# Patient Record
Sex: Male | Born: 1980 | Race: Black or African American | Hispanic: No | Marital: Married | Smoking: Current some day smoker
Health system: Southern US, Community
[De-identification: ages and names within clinical notes are randomized; demographics above are authoritative.]

## PROBLEM LIST (undated history)

## (undated) DIAGNOSIS — I10 Essential (primary) hypertension: Secondary | ICD-10-CM

## (undated) DIAGNOSIS — K5792 Diverticulitis of intestine, part unspecified, without perforation or abscess without bleeding: Secondary | ICD-10-CM

## (undated) HISTORY — PX: COLON SURGERY: SHX602

---

## 2008-10-29 HISTORY — PX: PARTIAL COLECTOMY: SHX5273

## 2012-10-20 ENCOUNTER — Emergency Department (HOSPITAL_COMMUNITY)
Admission: EM | Admit: 2012-10-20 | Discharge: 2012-10-20 | Disposition: A | Discharge status: Home / Self Care | Payer: Self-pay | Attending: Emergency Medicine | Admitting: Emergency Medicine

## 2012-10-20 ENCOUNTER — Emergency Department (HOSPITAL_COMMUNITY): Payer: Self-pay

## 2012-10-20 ENCOUNTER — Encounter (HOSPITAL_COMMUNITY): Payer: Self-pay | Admitting: Emergency Medicine

## 2012-10-20 DIAGNOSIS — M545 Low back pain, unspecified: Secondary | ICD-10-CM | POA: Insufficient documentation

## 2012-10-20 DIAGNOSIS — R05 Cough: Secondary | ICD-10-CM | POA: Insufficient documentation

## 2012-10-20 DIAGNOSIS — Z8719 Personal history of other diseases of the digestive system: Secondary | ICD-10-CM | POA: Insufficient documentation

## 2012-10-20 DIAGNOSIS — R109 Unspecified abdominal pain: Secondary | ICD-10-CM | POA: Insufficient documentation

## 2012-10-20 DIAGNOSIS — R059 Cough, unspecified: Secondary | ICD-10-CM | POA: Insufficient documentation

## 2012-10-20 DIAGNOSIS — R630 Anorexia: Secondary | ICD-10-CM | POA: Insufficient documentation

## 2012-10-20 DIAGNOSIS — R0789 Other chest pain: Secondary | ICD-10-CM | POA: Insufficient documentation

## 2012-10-20 DIAGNOSIS — F17200 Nicotine dependence, unspecified, uncomplicated: Secondary | ICD-10-CM | POA: Insufficient documentation

## 2012-10-20 DIAGNOSIS — I10 Essential (primary) hypertension: Secondary | ICD-10-CM | POA: Insufficient documentation

## 2012-10-20 HISTORY — DX: Diverticulitis of intestine, part unspecified, without perforation or abscess without bleeding: K57.92

## 2012-10-20 LAB — COMPREHENSIVE METABOLIC PANEL
ALT: 23 U/L (ref 0–53)
Calcium: 9.6 mg/dL (ref 8.4–10.5)
Creatinine, Ser: 0.81 mg/dL (ref 0.50–1.35)
GFR calc Af Amer: >90 mL/min (ref >90)
Glucose, Bld: 91 mg/dL (ref 70–99)
Sodium: 138 mEq/L (ref 135–145)
Total Protein: 7.7 g/dL (ref 6.0–8.3)

## 2012-10-20 LAB — URINALYSIS, ROUTINE W REFLEX MICROSCOPIC
Ketones, ur: NEGATIVE mg/dL
Leukocytes, UA: NEGATIVE
Nitrite: NEGATIVE
Protein, ur: NEGATIVE mg/dL
Urobilinogen, UA: 0.2 mg/dL (ref 0.0–1.0)

## 2012-10-20 LAB — CBC WITH DIFFERENTIAL/PLATELET
Eosinophils Absolute: 0.2 10*3/uL (ref 0.0–0.7)
Eosinophils Relative: 4 % (ref 0–5)
Lymphs Abs: 2.4 10*3/uL (ref 0.7–4.0)
MCH: 28.2 pg (ref 26.0–34.0)
MCV: 82.4 fL (ref 78.0–100.0)
Monocytes Absolute: 1 10*3/uL (ref 0.1–1.0)
Platelets: 278 10*3/uL (ref 150–400)
RBC: 4.76 MIL/uL (ref 4.22–5.81)
RDW: 15.7 % — ABNORMAL HIGH (ref 11.5–15.5)

## 2012-10-20 LAB — LIPASE, BLOOD: Lipase: 27 U/L (ref 11–59)

## 2012-10-20 LAB — TROPONIN I: Troponin I: <0.3 ng/mL (ref <0.30)

## 2012-10-20 LAB — PRO B NATRIURETIC PEPTIDE: Pro B Natriuretic peptide (BNP): 72.3 pg/mL (ref 0–125)

## 2012-10-20 LAB — LACTIC ACID, PLASMA: Lactic Acid, Venous: 0.8 mmol/L (ref 0.5–2.2)

## 2012-10-20 MED ORDER — HYDRALAZINE HCL 20 MG/ML IJ SOLN
10.0000 mg | Freq: Once | INTRAMUSCULAR | Status: AC
  Administered 2012-10-20: 22:00:00 via INTRAVENOUS
  Filled 2012-10-20: qty 0.5

## 2012-10-20 MED ORDER — ONDANSETRON HCL 4 MG/2ML IJ SOLN
4.0000 mg | Freq: Once | INTRAMUSCULAR | Status: AC
Start: 2012-10-20 — End: 2012-10-20
  Administered 2012-10-20: 4 mg via INTRAVENOUS
  Filled 2012-10-20: qty 2

## 2012-10-20 MED ORDER — LABETALOL HCL 5 MG/ML IV SOLN
20.0000 mg | Freq: Once | INTRAVENOUS | Status: AC
  Administered 2012-10-20: 20 mg via INTRAVENOUS
  Filled 2012-10-20: qty 4

## 2012-10-20 MED ORDER — SODIUM CHLORIDE 0.9 % IV BOLUS (SEPSIS)
1000.0000 mL | Freq: Once | INTRAVENOUS | Status: AC
  Administered 2012-10-20: 1000 mL via INTRAVENOUS

## 2012-10-20 MED ORDER — HYDROCODONE-ACETAMINOPHEN 5-325 MG PO TABS: 2.0000 | ORAL_TABLET | ORAL | Status: DC | PRN

## 2012-10-20 MED ORDER — HYDROMORPHONE HCL PF 1 MG/ML IJ SOLN
1.0000 mg | Freq: Once | INTRAMUSCULAR | Status: AC
  Administered 2012-10-20: 1 mg via INTRAVENOUS
  Filled 2012-10-20: qty 1

## 2012-10-20 MED ORDER — HYDROCHLOROTHIAZIDE 25 MG PO TABS: 25.0000 mg | ORAL_TABLET | Freq: Every day | ORAL | Status: DC

## 2012-10-20 MED ORDER — IOHEXOL 300 MG/ML  SOLN
100.0000 mL | Freq: Once | INTRAMUSCULAR | Status: AC | PRN
  Administered 2012-10-20: 100 mL via INTRAVENOUS

## 2012-10-20 MED ORDER — CLONIDINE HCL 0.1 MG PO TABS
0.1000 mg | ORAL_TABLET | Freq: Once | ORAL | Status: AC
  Administered 2012-10-20: 0.1 mg via ORAL
  Filled 2012-10-20: qty 1

## 2012-10-20 MED ORDER — OXYCODONE-ACETAMINOPHEN 5-325 MG PO TABS
2.0000 | ORAL_TABLET | Freq: Once | ORAL | Status: AC
  Administered 2012-10-20: 2 via ORAL
  Filled 2012-10-20: qty 1

## 2012-10-20 NOTE — ED Notes (Signed)
MD at bedside. 

## 2012-10-20 NOTE — ED Notes (Signed)
Prior to discharging by Eden Emms, RN pt BP was elevated. Dr notified by Eden Emms, RN and new orders given.

## 2012-10-20 NOTE — ED Notes (Signed)
Informed MD about BP.

## 2012-10-20 NOTE — ED Provider Notes (Signed)
History     CSN: [id number]  Arrival date & time 10/20/12  1545   First MD Initiated Contact with Patient 10/20/12 1623      Chief Complaint  Patient presents with  . Abdominal Pain    (Consider location/radiation/quality/duration/timing/severity/associated sxs/prior treatment) HPI Comments: Patient present with lower abdominal pain has been intermittent for the past month but constant over the past 3 days. He said similar pain in the past related to his diverticulitis. He had what sounds like a colectomy 3 years ago in [place]. He said intermittent pain since but never this bad. He denies any nausea, vomiting, fever chills. No diarrhea. No testicular pain. Pain is in his lower abdomen and radiates to his low back. He had constant chest tightness since this morning associated with dry cough. No shortness of breath. Pain improves with laying in fetal position.  The history is provided by the patient.    Past Medical History  Diagnosis Date  . Diverticulitis     History reviewed. No pertinent past surgical history.  History reviewed. No pertinent family history.  History  Substance Use Topics  . Smoking status: Current Some Day Smoker  . Smokeless tobacco: Not on file  . Alcohol Use: Yes      Review of Systems  Constitutional: Positive for activity change and appetite change. Negative for fever and fatigue.  HENT: Negative for congestion.   Respiratory: Negative for cough, chest tightness and shortness of breath.   Cardiovascular: Negative for chest pain.  Gastrointestinal: Positive for abdominal pain. Negative for nausea, vomiting and diarrhea.  Genitourinary: Negative for testicular pain.  Musculoskeletal: Positive for back pain.  Skin: Negative for rash.  Neurological: Negative for dizziness, weakness and headaches.  Hematological: Negative for adenopathy.  All other systems reviewed and are negative.  A complete 10 system review of systems was obtained and  all systems are negative except as noted in the HPI and PMH.   Allergies  Review of patient's allergies indicates no known allergies.  Home Medications  No current outpatient prescriptions on file.  BP 163/112  Pulse 75  Temp 99 F (37.2 C) (Oral)  Resp 18  SpO2 100%  Physical Exam  Constitutional: He is oriented to person, place, and time. He appears well-developed and well-nourished. No distress.  HENT:  Head: Normocephalic.  Mouth/Throat: Oropharynx is clear and moist. No oropharyngeal exudate.  Eyes: Conjunctivae normal and EOM are normal. Pupils are equal, round, and reactive to light.  Neck: Normal range of motion. Neck supple.  Cardiovascular: Normal rate, regular rhythm and normal heart sounds.   No murmur heard. Pulmonary/Chest: Effort normal and breath sounds normal. No respiratory distress.  Abdominal: Soft. Bowel sounds are normal. There is tenderness. There is no rebound and no guarding.       Well-healed midline surgical scar, lower abdominal pain without guarding or rebound.  Musculoskeletal: Normal range of motion. He exhibits no edema and no tenderness.  Neurological: He is alert and oriented to person, place, and time. No cranial nerve deficit. Coordination normal.  Skin: Skin is warm.    ED Course  Procedures (including critical care time)  Labs Reviewed  CBC WITH DIFFERENTIAL - Abnormal; Notable for the following:    RDW 15.7 (*)     Neutrophils Relative 34 (*)     Monocytes Relative 18 (*)     All other components within normal limits  URINALYSIS, ROUTINE W REFLEX MICROSCOPIC - Abnormal; Notable for the following:    Specific Gravity,  Urine 1.039 (*)     All other components within normal limits  COMPREHENSIVE METABOLIC PANEL  LIPASE, BLOOD  LACTIC ACID, PLASMA  TROPONIN I  PRO B NATRIURETIC PEPTIDE   Dg Chest 2 View  10/20/2012  *RADIOLOGY REPORT*  Clinical Data: Chest pain and nausea.  CHEST - 2 VIEW  Comparison: No priors.  Findings: Lung  volumes are normal.  No consolidative airspace disease.  No pleural effusions.  No pneumothorax.  Heart size is borderline enlarged.  Mild cephalization of the pulmonary vasculature, without frank pulmonary edema.  Upper mediastinal contours are unremarkable.  IMPRESSION: 1.  Borderline cardiomegaly with mild pulmonary venous congestion, but no frank pulmonary edema.   Original Report Authenticated By: Trudie Reed, M.D.    Ct Abdomen Pelvis W Contrast  10/20/2012  *RADIOLOGY REPORT*  Clinical Data: Abdominal pain  CT ABDOMEN AND PELVIS WITH CONTRAST  Technique:  Multidetector CT imaging of the abdomen and pelvis was performed following the standard protocol during bolus administration of intravenous contrast.  Contrast: OMNIPAQUE IOHEXOL 300 MG/ML  SOLN  Comparison: None.  Findings: Liver, spleen, pancreas, adrenal glands, kidneys are within normal limits.  Gallbladder is decompressed.  Normal appendix.  Unremarkable bladder and prostate gland.  No free fluid.  No abnormal adenopathy by measurement criteria.  No disproportionate dilatation of bowel to suggest obstruction.  Several accessory ossicles involving the superior acetabulum bilaterally are noted.  No destructive bone lesion.  Images of the lower thorax demonstrate suspected cardiomegaly.  There is stranding in the subcutaneous flap at the midline of the lower abdomen likely from postoperative changes.  IMPRESSION: No acute intra-abdominal pathology.  Cardiomegaly.  Chest radiograph may be helpful.   Original Report Authenticated By: Jolaine Click, M.D.      No diagnosis found.    MDM  Abdominal pain, history of diverticulitis and colectomy. Hypertensive, abdomen soft and nonsurgical. No distress.  Work up is negative for acute pathology. Labs unremarkable. No acute abnormality on CT scan. Incidental borderline cardiomegaly noted. Patient has LVH changes and EKG.  Is tolerating by mouth fluids and has not had any vomiting in the  ED. Needs to establish care with PCP. Resource guide given.  I explained to him that he is serious hypertension at risk for eye damage, kidney damage, heart damage other medical problems. His wife states to make sure that he gets a Dr. Patient remains more concerned about his abdominal pain has been chronic for months. I assured him that his workup for his abdominal pain was negative.  He needs to establish care with a primary doctor.    Date: 10/20/2012  Rate: 68  Rhythm: normal sinus rhythm  QRS Axis: normal  Intervals: normal  ST/T Wave abnormalities: ST depressions inferiorly  Conduction Disutrbances:none  Narrative Interpretation: LVH  Old EKG Reviewed: none available       Glynn Octave, MD 10/20/12 2309

## 2012-10-20 NOTE — ED Notes (Signed)
Per patient has had abdominal pain for a month-getting worse

## 2015-11-12 ENCOUNTER — Emergency Department (HOSPITAL_BASED_OUTPATIENT_CLINIC_OR_DEPARTMENT_OTHER): Payer: Self-pay

## 2015-11-12 ENCOUNTER — Encounter (HOSPITAL_BASED_OUTPATIENT_CLINIC_OR_DEPARTMENT_OTHER): Payer: Self-pay | Admitting: Emergency Medicine

## 2015-11-12 ENCOUNTER — Emergency Department (HOSPITAL_BASED_OUTPATIENT_CLINIC_OR_DEPARTMENT_OTHER)
Admission: EM | Admit: 2015-11-12 | Discharge: 2015-11-13 | Disposition: A | Discharge status: Home / Self Care | Payer: Self-pay | Attending: Emergency Medicine | Admitting: Emergency Medicine

## 2015-11-12 DIAGNOSIS — R1084 Generalized abdominal pain: Secondary | ICD-10-CM | POA: Insufficient documentation

## 2015-11-12 DIAGNOSIS — Z8719 Personal history of other diseases of the digestive system: Secondary | ICD-10-CM | POA: Insufficient documentation

## 2015-11-12 DIAGNOSIS — I10 Essential (primary) hypertension: Secondary | ICD-10-CM | POA: Insufficient documentation

## 2015-11-12 DIAGNOSIS — F17200 Nicotine dependence, unspecified, uncomplicated: Secondary | ICD-10-CM | POA: Insufficient documentation

## 2015-11-12 DIAGNOSIS — R197 Diarrhea, unspecified: Secondary | ICD-10-CM | POA: Insufficient documentation

## 2015-11-12 HISTORY — DX: Essential (primary) hypertension: I10

## 2015-11-12 LAB — COMPREHENSIVE METABOLIC PANEL
ALBUMIN: 3.8 g/dL (ref 3.5–5.0)
ALK PHOS: 68 U/L (ref 38–126)
ALT: 42 U/L (ref 17–63)
ANION GAP: 6 (ref 5–15)
AST: 33 U/L (ref 15–41)
BUN: 13 mg/dL (ref 6–20)
CALCIUM: 9.2 mg/dL (ref 8.9–10.3)
CO2: 28 mmol/L (ref 22–32)
Chloride: 105 mmol/L (ref 101–111)
Creatinine, Ser: 0.96 mg/dL (ref 0.61–1.24)
Glucose, Bld: 95 mg/dL (ref 65–99)
Potassium: 3.9 mmol/L (ref 3.5–5.1)
SODIUM: 139 mmol/L (ref 135–145)
TOTAL PROTEIN: 7.9 g/dL (ref 6.5–8.1)
Total Bilirubin: 0.3 mg/dL (ref 0.3–1.2)

## 2015-11-12 LAB — URINE MICROSCOPIC-ADD ON

## 2015-11-12 LAB — CBC WITH DIFFERENTIAL/PLATELET
BASOS ABS: 0 10*3/uL (ref 0.0–0.1)
BASOS PCT: 1 %
EOS ABS: 0.1 10*3/uL (ref 0.0–0.7)
Eosinophils Relative: 2 %
HCT: 39.1 % (ref 39.0–52.0)
Hemoglobin: 13 g/dL (ref 13.0–17.0)
LYMPHS ABS: 2.7 10*3/uL (ref 0.7–4.0)
Lymphocytes Relative: 33 %
MCH: 27.4 pg (ref 26.0–34.0)
MCHC: 33.2 g/dL (ref 30.0–36.0)
MCV: 82.3 fL (ref 78.0–100.0)
Monocytes Absolute: 1.2 10*3/uL — ABNORMAL HIGH (ref 0.1–1.0)
Monocytes Relative: 15 %
NEUTROS PCT: 51 %
Neutro Abs: 4.1 10*3/uL (ref 1.7–7.7)
PLATELETS: 303 10*3/uL (ref 150–400)
RBC: 4.75 MIL/uL (ref 4.22–5.81)
RDW: 16.6 % — AB (ref 11.5–15.5)
WBC: 8.1 10*3/uL (ref 4.0–10.5)

## 2015-11-12 LAB — URINALYSIS, ROUTINE W REFLEX MICROSCOPIC
BILIRUBIN URINE: NEGATIVE
Glucose, UA: NEGATIVE mg/dL
KETONES UR: NEGATIVE mg/dL
Leukocytes, UA: NEGATIVE
NITRITE: NEGATIVE
PROTEIN: NEGATIVE mg/dL
SPECIFIC GRAVITY, URINE: 1.027 (ref 1.005–1.030)
pH: 5.5 (ref 5.0–8.0)

## 2015-11-12 LAB — LIPASE, BLOOD: LIPASE: 25 U/L (ref 11–51)

## 2015-11-12 MED ORDER — IOHEXOL 300 MG/ML  SOLN
100.0000 mL | Freq: Once | INTRAMUSCULAR | Status: AC | PRN
  Administered 2015-11-12: 100 mL via INTRAVENOUS

## 2015-11-12 MED ORDER — ONDANSETRON HCL 4 MG/2ML IJ SOLN
4.0000 mg | Freq: Once | INTRAMUSCULAR | Status: AC
  Administered 2015-11-12: 4 mg via INTRAVENOUS
  Filled 2015-11-12: qty 2

## 2015-11-12 MED ORDER — HYDROMORPHONE HCL 1 MG/ML IJ SOLN
1.0000 mg | Freq: Once | INTRAMUSCULAR | Status: AC
  Administered 2015-11-12: 1 mg via INTRAVENOUS
  Filled 2015-11-12: qty 1

## 2015-11-12 MED ORDER — DIPHENHYDRAMINE HCL 50 MG/ML IJ SOLN
25.0000 mg | Freq: Once | INTRAMUSCULAR | Status: AC
  Administered 2015-11-12: 25 mg via INTRAVENOUS
  Filled 2015-11-12: qty 1

## 2015-11-12 MED ORDER — IOHEXOL 300 MG/ML  SOLN
50.0000 mL | Freq: Once | INTRAMUSCULAR | Status: AC | PRN
  Administered 2015-11-12: 50 mL via ORAL

## 2015-11-12 NOTE — ED Notes (Signed)
PA Josh at bedside. 

## 2015-11-12 NOTE — ED Provider Notes (Signed)
CSN: [id number]     Arrival date & time 11/12/15  1749 History   First MD Initiated Contact with Patient 11/12/15 1959     Chief Complaint  Patient presents with  . Abdominal Pain     (Consider location/radiation/quality/duration/timing/severity/associated sxs/prior Treatment) HPI Comments: Patient with history of abdominal surgery due to diverticulitis presents with complaint of abdominal pain similar to flares that he has had in past. Patient describes suprapubic pain starting this morning with radiation to the left lower quadrant. He states that he has had diarrhea for several months which is unchanged. No nausea or vomiting. No fever. Patient has had some achiness in his left chest that he states he has his blood pressure is elevated. He reports being out of his blood pressure medications for approximately the past month. He was previously on lisinopril and amlodipine. No treatments prior to arrival.  Patient is a 35 y.o. male presenting with abdominal pain. The history is provided by the patient.  Abdominal Pain Associated symptoms: chest pain and diarrhea   Associated symptoms: no cough, no dysuria, no fever, no nausea, no sore throat and no vomiting     Past Medical History  Diagnosis Date  . Diverticulitis   . Hypertension    History reviewed. No pertinent past surgical history. History reviewed. No pertinent family history. Social History  Substance Use Topics  . Smoking status: Current Some Day Smoker  . Smokeless tobacco: None  . Alcohol Use: Yes    Review of Systems  Constitutional: Negative for fever.  HENT: Negative for rhinorrhea and sore throat.   Eyes: Negative for redness.  Respiratory: Negative for cough.   Cardiovascular: Positive for chest pain.  Gastrointestinal: Positive for abdominal pain and diarrhea. Negative for nausea and vomiting.  Genitourinary: Negative for dysuria.  Musculoskeletal: Negative for myalgias.  Skin: Negative for rash.   Neurological: Negative for headaches.    Allergies  Review of patient's allergies indicates no known allergies.  Home Medications   Prior to Admission medications   Not on File   BP 170/118 mmHg  Pulse 76  Temp(Src) 98 F (36.7 C) (Oral)  Resp 20  Ht 6\' 5"  (1.956 m)  Wt 117.935 kg  BMI 30.83 kg/m2  SpO2 95%   Physical Exam  Constitutional: He appears well-developed and well-nourished.  HENT:  Head: Normocephalic and atraumatic.  Mouth/Throat: Oropharynx is clear and moist and mucous membranes are normal. Mucous membranes are not dry.  Eyes: Conjunctivae are normal. Right eye exhibits no discharge. Left eye exhibits no discharge.  Neck: Trachea normal and normal range of motion. Neck supple. Normal carotid pulses and no JVD present. No muscular tenderness present. Carotid bruit is not present. No tracheal deviation present.  Cardiovascular: Normal rate, regular rhythm, S1 normal, S2 normal, normal heart sounds and intact distal pulses.  Exam reveals no distant heart sounds and no decreased pulses.   No murmur heard. Pulmonary/Chest: Effort normal and breath sounds normal. No respiratory distress. He has no wheezes. He exhibits no tenderness.  Abdominal: Soft. Normal aorta and bowel sounds are normal. There is tenderness (Moderate, suprapubic left lower quadrant). There is no rebound and no guarding.  Musculoskeletal: He exhibits no edema.  Neurological: He is alert.  Skin: Skin is warm and dry. He is not diaphoretic. No cyanosis. No pallor.  Psychiatric: He has a normal mood and affect.  Nursing note and vitals reviewed.   ED Course  Procedures (including critical care time) Labs Review Labs Reviewed  URINALYSIS, ROUTINE  W REFLEX MICROSCOPIC (NOT AT [facility]) - Abnormal; Notable for the following:    Hgb urine dipstick TRACE (*)    All other components within normal limits  CBC WITH DIFFERENTIAL/PLATELET - Abnormal; Notable for the following:    RDW 16.6 (*)    Monocytes  Absolute 1.2 (*)    All other components within normal limits  URINE MICROSCOPIC-ADD ON - Abnormal; Notable for the following:    Squamous Epithelial / LPF 0-5 (*)    Bacteria, UA MANY (*)    Casts HYALINE CASTS (*)    All other components within normal limits  COMPREHENSIVE METABOLIC PANEL  LIPASE, BLOOD    Imaging Review Ct Abdomen Pelvis W Contrast  11/12/2015  CLINICAL DATA:  35 year old male with acute onset of abdominal pain and chest pain. History of diverticulitis. EXAM: CT ABDOMEN AND PELVIS WITH CONTRAST TECHNIQUE: Multidetector CT imaging of the abdomen and pelvis was performed using the standard protocol following bolus administration of intravenous contrast. CONTRAST:  50mL OMNIPAQUE IOHEXOL 300 MG/ML SOLN, 100mL OMNIPAQUE IOHEXOL 300 MG/ML SOLN COMPARISON:  CT dated 11/10/2014 FINDINGS: The visualized lungs are clear. No intra-abdominal free air or free fluid. The liver, gallbladder, pancreas, spleen, adrenal glands, kidneys, visualized ureters, and urinary bladder appear unremarkable. The prostate and seminal vesicles are grossly unremarkable. There is sigmoid diverticulosis and scattered colonic diverticula without active inflammation. Minimal pericolonic stranding in the mid descending colon likely represents some chronic changes/scarring. Mild diverticulitis is less likely. This postsurgical changes of partial sigmoid resection with anastomotic sutures. No evidence of bowel obstruction. Normal appendix. For the abdominal aorta and IVC appear patent. No portal venous gas identified. There is no adenopathy. Midline vertical anterior pelvic wall incisional scar. Small fat containing umbilical hernia. Eft stable appearing bony fragments adjacent to the acetabula again noted similar to prior study and may represent ossicles. No acute fracture identified. IMPRESSION: Diverticulosis without definite active inflammatory changes. No bowel obstruction. Normal appendix. Electronically Signed    By: Elgie CollardArash  Radparvar M.D.   On: 11/12/2015 23:58   I have personally reviewed and evaluated these images and lab results as part of my medical decision-making.   EKG Interpretation   Date/Time:  Saturday November 12 2015 18:03:26 EST Ventricular Rate:  88 PR Interval:  158 QRS Duration: 104 QT Interval:  398 QTC Calculation: 481 R Axis:   73 Text Interpretation:  Normal sinus rhythm Minimal voltage criteria for  LVH, may be normal variant Prolonged QT Abnormal ECG No previous ECGs  available Confirmed by Manus GunningANCOUR  MD, STEPHEN [phone]([id number]) on 11/12/2015 6:07:00  PM       8:35 PM Patient seen and examined. Work-up initiated. Medications ordered.   Vital signs reviewed and are as follows: BP 170/118 mmHg  Pulse 76  Temp(Src) 98 F (36.7 C) (Oral)  Resp 20  Ht 6\' 5"  (1.956 m)  Wt 117.935 kg  BMI 30.83 kg/m2  SpO2 95%  10:20 PM Patient with continued abdominal pain despite 2 doses IV pain medication. Labs are reassuring, but given his history and the fact that I cannot get his pain under control, will need to order CT scan to rule out more significant process.  12:15 AM CT performed and is negative. Patient informed of results. Symptoms are relatively well controlled at this time. Plan is for discharge to home with pain medication. No indications for antibiotics. Patient has PCP and GI follow-up. Encouraged to follow-up with them. Patient states that he feels comfortable going home.  Patient did have  some itching prior to CT. This is likely related to IV narcotic. Benadryl was given with good relief. No signs of anaphylaxis at any time.  The patient was urged to return to the Emergency Department immediately with worsening of current symptoms, worsening abdominal pain, persistent vomiting, blood noted in stools, fever, or any other concerns. The patient verbalized understanding.   Patient counseled on use of narcotic pain medications. Counseled not to combine these medications with  others containing tylenol. Urged not to drink alcohol, drive, or perform any other activities that requires focus while taking these medications. The patient verbalizes understanding and agrees with the plan.    MDM   Final diagnoses:  Generalized abdominal pain  Essential hypertension   Abd pain: Labs and CT are reassuring. No evidence of complication from previous surgery. No evidence of active diverticulitis. Feel symptomatic control appropriate at this time. No indications for admission.  Hypertension: No acute onset of end organ damage.   Renne Crigler, PA-C 11/13/15 1342  Glynn Octave, MD 11/13/15 [phone]

## 2015-11-12 NOTE — ED Notes (Signed)
Pt reports acute onset of abdominal pain and chest pain this am, reports history of diverticulitis, + nausea, no vomiting or diarrhea

## 2015-11-13 MED ORDER — AMLODIPINE BESYLATE 10 MG PO TABS: 10.0000 mg | ORAL_TABLET | Freq: Every day | ORAL | Status: DC

## 2015-11-13 MED ORDER — HYDROCODONE-ACETAMINOPHEN 5-325 MG PO TABS: ORAL_TABLET | ORAL | Status: DC

## 2015-11-13 MED ORDER — LISINOPRIL 10 MG PO TABS: 10.0000 mg | ORAL_TABLET | Freq: Every day | ORAL | Status: DC

## 2015-11-13 NOTE — ED Notes (Signed)
Ass[place] care of patient from [place]. Pt resting quietly at this time, as he has just returned from CT. VSS. States he has been itching since earlier in his ED visit. Updated EDP and meds order. Call bell within reach. No respiratory distress noted. Family at side.

## 2015-11-13 NOTE — Discharge Instructions (Signed)
Please read and follow all provided instructions.  Your diagnoses today include:  1. Generalized abdominal pain   2. Essential hypertension     Tests performed today include:  Blood counts and electrolytes  Blood tests to check liver and kidney function  Blood tests to check pancreas function  Urine test to look for infection  CT scan - no current infection  Vital signs. See below for your results today.   Medications prescribed:   Vicodin (hydrocodone/acetaminophen) - narcotic pain medication  DO NOT drive or perform any activities that require you to be awake and alert because this medicine can make you drowsy. BE VERY CAREFUL not to take multiple medicines containing Tylenol (also called acetaminophen). Doing so can lead to an overdose which can damage your liver and cause liver failure and possibly death.  Take any prescribed medications only as directed.  Home care instructions:   Follow any educational materials contained in this packet.  Follow-up instructions: Please follow-up with your primary care provider in the next 3 days for further evaluation of your symptoms.    Return instructions:  SEEK IMMEDIATE MEDICAL ATTENTION IF:  The pain does not go away or becomes severe   A temperature above 101F develops   Repeated vomiting occurs (multiple episodes)   The pain becomes localized to portions of the abdomen. The right side could possibly be appendicitis. In an adult, the left lower portion of the abdomen could be colitis or diverticulitis.   Blood is being passed in stools or vomit (bright red or black tarry stools)   You develop chest pain, difficulty breathing, dizziness or fainting, or become confused, poorly responsive, or inconsolable (young children)  If you have any other emergent concerns regarding your health  Additional Information: Abdominal (belly) pain can be caused by many things. Your caregiver performed an examination and possibly ordered  blood/urine tests and imaging (CT scan, x-rays, ultrasound). Many cases can be observed and treated at home after initial evaluation in the emergency department. Even though you are being discharged home, abdominal pain can be unpredictable. Therefore, you need a repeated exam if your pain does not resolve, returns, or worsens. Most patients with abdominal pain don't have to be admitted to the hospital or have surgery, but serious problems like appendicitis and gallbladder attacks can start out as nonspecific pain. Many abdominal conditions cannot be diagnosed in one visit, so follow-up evaluations are very important.  Your vital signs today were: BP 188/126 mmHg   Pulse 82   Temp(Src) 98 F (36.7 C) (Oral)   Resp 20   Ht 6\' 5"  (1.956 m)   Wt 117.935 kg   BMI 30.83 kg/m2   SpO2 98% If your blood pressure (bp) was elevated above 135/85 this visit, please have this repeated by your doctor within one month. --------------

## 2015-11-13 NOTE — ED Notes (Signed)
Josh, PA, Primary for patient, made aware of Blood Pressure - 178/113.

## 2015-12-18 ENCOUNTER — Encounter (HOSPITAL_BASED_OUTPATIENT_CLINIC_OR_DEPARTMENT_OTHER): Payer: Self-pay | Admitting: *Deleted

## 2015-12-18 ENCOUNTER — Emergency Department (HOSPITAL_BASED_OUTPATIENT_CLINIC_OR_DEPARTMENT_OTHER)
Admission: EM | Admit: 2015-12-18 | Discharge: 2015-12-18 | Disposition: A | Discharge status: Home / Self Care | Payer: Self-pay | Attending: Emergency Medicine | Admitting: Emergency Medicine

## 2015-12-18 ENCOUNTER — Other Ambulatory Visit: Payer: Self-pay

## 2015-12-18 DIAGNOSIS — R109 Unspecified abdominal pain: Secondary | ICD-10-CM

## 2015-12-18 DIAGNOSIS — R1030 Lower abdominal pain, unspecified: Secondary | ICD-10-CM | POA: Insufficient documentation

## 2015-12-18 DIAGNOSIS — Z760 Encounter for issue of repeat prescription: Secondary | ICD-10-CM | POA: Insufficient documentation

## 2015-12-18 DIAGNOSIS — R079 Chest pain, unspecified: Secondary | ICD-10-CM | POA: Insufficient documentation

## 2015-12-18 DIAGNOSIS — Z8719 Personal history of other diseases of the digestive system: Secondary | ICD-10-CM | POA: Insufficient documentation

## 2015-12-18 DIAGNOSIS — I10 Essential (primary) hypertension: Secondary | ICD-10-CM | POA: Insufficient documentation

## 2015-12-18 DIAGNOSIS — F17200 Nicotine dependence, unspecified, uncomplicated: Secondary | ICD-10-CM | POA: Insufficient documentation

## 2015-12-18 LAB — URINALYSIS, ROUTINE W REFLEX MICROSCOPIC
Bilirubin Urine: NEGATIVE
GLUCOSE, UA: NEGATIVE mg/dL
KETONES UR: NEGATIVE mg/dL
Nitrite: NEGATIVE
PROTEIN: NEGATIVE mg/dL
Specific Gravity, Urine: 1.024 (ref 1.005–1.030)
pH: 5.5 (ref 5.0–8.0)

## 2015-12-18 LAB — COMPREHENSIVE METABOLIC PANEL
ALBUMIN: 4.4 g/dL (ref 3.5–5.0)
ALK PHOS: 62 U/L (ref 38–126)
ALT: 30 U/L (ref 17–63)
ANION GAP: 9 (ref 5–15)
AST: 23 U/L (ref 15–41)
BILIRUBIN TOTAL: 0.5 mg/dL (ref 0.3–1.2)
BUN: 14 mg/dL (ref 6–20)
CALCIUM: 9.3 mg/dL (ref 8.9–10.3)
CO2: 25 mmol/L (ref 22–32)
Chloride: 106 mmol/L (ref 101–111)
Creatinine, Ser: 0.89 mg/dL (ref 0.61–1.24)
GFR calc non Af Amer: >60 mL/min (ref >60)
GLUCOSE: 102 mg/dL — AB (ref 65–99)
POTASSIUM: 3.5 mmol/L (ref 3.5–5.1)
Sodium: 140 mmol/L (ref 135–145)
TOTAL PROTEIN: 8.3 g/dL — AB (ref 6.5–8.1)

## 2015-12-18 LAB — URINE MICROSCOPIC-ADD ON: BACTERIA UA: NONE SEEN

## 2015-12-18 LAB — CBC
HEMATOCRIT: 38.2 % — AB (ref 39.0–52.0)
HEMOGLOBIN: 12.9 g/dL — AB (ref 13.0–17.0)
MCH: 27.3 pg (ref 26.0–34.0)
MCHC: 33.8 g/dL (ref 30.0–36.0)
MCV: 80.8 fL (ref 78.0–100.0)
Platelets: 288 10*3/uL (ref 150–400)
RBC: 4.73 MIL/uL (ref 4.22–5.81)
RDW: 17.2 % — ABNORMAL HIGH (ref 11.5–15.5)
WBC: 7 10*3/uL (ref 4.0–10.5)

## 2015-12-18 LAB — TROPONIN I

## 2015-12-18 LAB — BRAIN NATRIURETIC PEPTIDE: B Natriuretic Peptide: 4.4 pg/mL (ref 0.0–100.0)

## 2015-12-18 MED ORDER — OXYCODONE-ACETAMINOPHEN 5-325 MG PO TABS
1.0000 | ORAL_TABLET | Freq: Once | ORAL | Status: AC
  Administered 2015-12-18: 1 via ORAL
  Filled 2015-12-18: qty 1

## 2015-12-18 MED ORDER — LISINOPRIL 10 MG PO TABS: 10.0000 mg | ORAL_TABLET | Freq: Every day | ORAL | Status: DC

## 2015-12-18 MED ORDER — AMLODIPINE BESYLATE 10 MG PO TABS: 10.0000 mg | ORAL_TABLET | Freq: Every day | ORAL | Status: DC

## 2015-12-18 NOTE — ED Notes (Signed)
Pt here for epigastric and left CP that has been "going on for years".  Pt has been having sob with this.  Pt states that the pain is worse than usual.  Pt denies having cardiac hx or having been evaluated for this pain he has been having for years.  Pt is hypertensive in triage and reports that he is taking BP meds as prescribed. Last med today

## 2015-12-18 NOTE — Discharge Instructions (Signed)
Return to the ED with any concerns including vomiting and not able to keep down liquids, difficulty urinating, fever/chills, decreased level of alertness/lethargy, or any other alarming symptoms

## 2015-12-18 NOTE — ED Provider Notes (Signed)
CSN: [id number]     Arrival date & time 12/18/15  1912 History  By signing my name below, I, Linus Galas, attest that this documentation has been prepared under the direction and in the presence of No att. providers found. Electronically Signed: Linus Galas, ED Scribe. 12/18/2015. 11:01 PM.   Chief Complaint  Patient presents with  . Abdominal Pain  . Chest Pain   Patient is a 35 y.o. male presenting with abdominal pain. The history is provided by the patient. No language interpreter was used.  Abdominal Pain Pain location:  Epigastric Pain radiates to:  Does not radiate Pain severity:  Mild Onset quality:  Gradual Timing:  Intermittent Progression:  Worsening Chronicity:  Recurrent Relieved by:  Nothing Worsened by:  Nothing tried Ineffective treatments:  None tried Associated symptoms: no chills, no diarrhea, no fever, no nausea and no vomiting    HPI Comments: Joshua Patel is a 35 y.o. male with a PMHx diverticulitis and HTN who presents to the Emergency Department complaining of intermittent epigastric abdominal pain that worsened today. Pt reports this pain is similar to his pain he had in January 2016 when seen in the ED. Pt denies any fevers, chills, nausea, vomiting, diarrhea, or any other symptoms at this time. Pt is compliant with BP medication but had his last dose today.   Past Medical History  Diagnosis Date  . Diverticulitis   . Hypertension    History reviewed. No pertinent past surgical history. No family history on file. Social History  Substance Use Topics  . Smoking status: Current Some Day Smoker  . Smokeless tobacco: None  . Alcohol Use: Yes    Review of Systems  Constitutional: Negative for fever and chills.  Gastrointestinal: Positive for abdominal pain. Negative for nausea, vomiting and diarrhea.  All other systems reviewed and are negative.   Allergies  Shellfish allergy and Contrast media  Home Medications   Prior to Admission  medications   Medication Sig Start Date End Date Taking? Authorizing Provider  amLODipine (NORVASC) 10 MG tablet Take 1 tablet (10 mg total) by mouth daily. 12/18/15   Jerelyn Scott, MD  HYDROcodone-acetaminophen (NORCO/VICODIN) 5-325 MG tablet Take 1-2 tablets every 6 hours as needed for severe pain 11/13/15   Renne Crigler, PA-C  lisinopril (PRINIVIL,ZESTRIL) 10 MG tablet Take 1 tablet (10 mg total) by mouth daily. 12/18/15   Jerelyn Scott, MD   BP 141/94 mmHg  Pulse 78  Temp(Src) 98 F (36.7 C) (Oral)  Resp 18  Wt 250 lb (113.399 kg)  SpO2 98%  Vitals reviewed Physical Exam  Physical Examination: General appearance - alert, well appearing, and in no distress Mental status - alert, oriented to person, place, and time Eyes - no conjunctival injection, no scleral icterus Mouth - mucous membranes moist, pharynx normal without lesions Chest - clear to auscultation, no wheezes, rales or rhonchi, symmetric air entry Heart - normal rate, regular rhythm, normal S1, S2, no murmurs, rubs, clicks or gallops Abdomen - soft, mild lower abdominal/suprapubic tenderness to palpation, no gaurding or rebound tenderness, nondistended, no masses or organomegaly Neurological - alert, oriented, normal speech Extremities - peripheral pulses normal, no pedal edema, no clubbing or cyanosis Skin - normal coloration and turgor, no rashes  ED Course  Procedures   DIAGNOSTIC STUDIES: Oxygen Saturation is 94% on room air, normal by my interpretation.    COORDINATION OF CARE: 9:00 PM Will give pain medication. Will order blood work, urinalysis, and EKG.  Discussed treatment plan with pt  at bedside and pt agreed to plan.   Labs Review Labs Reviewed  URINALYSIS, ROUTINE W REFLEX MICROSCOPIC (NOT AT [facility]) - Abnormal; Notable for the following:    Hgb urine dipstick MODERATE (*)    Leukocytes, UA TRACE (*)    All other components within normal limits  COMPREHENSIVE METABOLIC PANEL - Abnormal; Notable for the  following:    Glucose, Bld 102 (*)    Total Protein 8.3 (*)    All other components within normal limits  CBC - Abnormal; Notable for the following:    Hemoglobin 12.9 (*)    HCT 38.2 (*)    RDW 17.2 (*)    All other components within normal limits  URINE MICROSCOPIC-ADD ON - Abnormal; Notable for the following:    Squamous Epithelial / LPF 0-5 (*)    All other components within normal limits  TROPONIN I  BRAIN NATRIURETIC PEPTIDE    Imaging Review No results found. I have personally reviewed and evaluated these images and lab results as part of my medical decision-making.   EKG Interpretation None      MDM   Final diagnoses:  Abdominal pain, unspecified abdominal location    Pt presenting with c/o lower abdominal pain, also hypertensive.  He states he took his last bp meds today and is requesting a refill.  Labs are reassuring, urine is also reassuring.  Per chart review he had normal CT scan with similar symptoms recently.  D/w patient the importance of followup as an outpatient if symptoms continue.  Discharged with strict return precautions.  Pt agreeable with plan.  I personally performed the services described in this documentation, which was scribed in my presence. The recorded information has been reviewed and is accurate.     Jerelyn Scott, MD 12/18/15 [phone]

## 2016-05-08 ENCOUNTER — Observation Stay (HOSPITAL_BASED_OUTPATIENT_CLINIC_OR_DEPARTMENT_OTHER)
Admission: EM | Admit: 2016-05-08 | Discharge: 2016-05-10 | Disposition: A | Discharge status: Home / Self Care | Payer: Managed Care, Other (non HMO) | Attending: Internal Medicine | Admitting: Internal Medicine

## 2016-05-08 ENCOUNTER — Emergency Department (HOSPITAL_BASED_OUTPATIENT_CLINIC_OR_DEPARTMENT_OTHER): Payer: Managed Care, Other (non HMO)

## 2016-05-08 ENCOUNTER — Encounter (HOSPITAL_BASED_OUTPATIENT_CLINIC_OR_DEPARTMENT_OTHER): Payer: Self-pay

## 2016-05-08 DIAGNOSIS — I10 Essential (primary) hypertension: Secondary | ICD-10-CM | POA: Diagnosis present

## 2016-05-08 DIAGNOSIS — K56600 Partial intestinal obstruction, unspecified as to cause: Secondary | ICD-10-CM

## 2016-05-08 DIAGNOSIS — F17200 Nicotine dependence, unspecified, uncomplicated: Secondary | ICD-10-CM | POA: Diagnosis not present

## 2016-05-08 DIAGNOSIS — Z9049 Acquired absence of other specified parts of digestive tract: Secondary | ICD-10-CM | POA: Diagnosis not present

## 2016-05-08 DIAGNOSIS — Z8719 Personal history of other diseases of the digestive system: Secondary | ICD-10-CM | POA: Diagnosis not present

## 2016-05-08 DIAGNOSIS — R109 Unspecified abdominal pain: Secondary | ICD-10-CM | POA: Diagnosis present

## 2016-05-08 DIAGNOSIS — K56609 Unspecified intestinal obstruction, unspecified as to partial versus complete obstruction: Secondary | ICD-10-CM

## 2016-05-08 DIAGNOSIS — K5669 Other intestinal obstruction: Secondary | ICD-10-CM | POA: Diagnosis not present

## 2016-05-08 LAB — URINALYSIS, ROUTINE W REFLEX MICROSCOPIC
Bilirubin Urine: NEGATIVE
GLUCOSE, UA: NEGATIVE mg/dL
Hgb urine dipstick: NEGATIVE
Ketones, ur: 15 mg/dL — AB
NITRITE: NEGATIVE
PH: 5.5 (ref 5.0–8.0)
PROTEIN: NEGATIVE mg/dL
Specific Gravity, Urine: 1.029 (ref 1.005–1.030)

## 2016-05-08 LAB — COMPREHENSIVE METABOLIC PANEL
ALBUMIN: 4 g/dL (ref 3.5–5.0)
ALK PHOS: 62 U/L (ref 38–126)
ALT: 36 U/L (ref 17–63)
ANION GAP: 8 (ref 5–15)
AST: 27 U/L (ref 15–41)
BILIRUBIN TOTAL: 0.4 mg/dL (ref 0.3–1.2)
BUN: 11 mg/dL (ref 6–20)
CALCIUM: 9.2 mg/dL (ref 8.9–10.3)
CO2: 28 mmol/L (ref 22–32)
CREATININE: 1.01 mg/dL (ref 0.61–1.24)
Chloride: 104 mmol/L (ref 101–111)
GFR calc non Af Amer: >60 mL/min (ref >60)
GLUCOSE: 98 mg/dL (ref 65–99)
Potassium: 3.8 mmol/L (ref 3.5–5.1)
Sodium: 140 mmol/L (ref 135–145)
TOTAL PROTEIN: 8 g/dL (ref 6.5–8.1)

## 2016-05-08 LAB — URINE MICROSCOPIC-ADD ON

## 2016-05-08 LAB — CBC WITH DIFFERENTIAL/PLATELET
Basophils Absolute: 0 10*3/uL (ref 0.0–0.1)
Basophils Relative: 1 %
EOS ABS: 0.1 10*3/uL (ref 0.0–0.7)
EOS PCT: 1 %
HCT: 39.4 % (ref 39.0–52.0)
Hemoglobin: 13.4 g/dL (ref 13.0–17.0)
LYMPHS ABS: 2.4 10*3/uL (ref 0.7–4.0)
Lymphocytes Relative: 39 %
MCH: 28.8 pg (ref 26.0–34.0)
MCHC: 34 g/dL (ref 30.0–36.0)
MCV: 84.7 fL (ref 78.0–100.0)
MONO ABS: 1.1 10*3/uL — AB (ref 0.1–1.0)
Monocytes Relative: 17 %
Neutro Abs: 2.6 10*3/uL (ref 1.7–7.7)
Neutrophils Relative %: 42 %
PLATELETS: 264 10*3/uL (ref 150–400)
RBC: 4.65 MIL/uL (ref 4.22–5.81)
RDW: 15.9 % — AB (ref 11.5–15.5)
WBC: 6.2 10*3/uL (ref 4.0–10.5)

## 2016-05-08 LAB — LIPASE, BLOOD: Lipase: 27 U/L (ref 11–51)

## 2016-05-08 MED ORDER — DEXTROSE 5 % IV SOLN
1.0000 g | Freq: Once | INTRAVENOUS | Status: AC
  Administered 2016-05-08: 1 g via INTRAVENOUS
  Filled 2016-05-08: qty 10

## 2016-05-08 MED ORDER — AMLODIPINE BESYLATE 5 MG PO TABS
5.0000 mg | ORAL_TABLET | Freq: Once | ORAL | Status: AC
  Administered 2016-05-08: 5 mg via ORAL
  Filled 2016-05-08: qty 1

## 2016-05-08 MED ORDER — AMLODIPINE BESYLATE 5 MG PO TABS
5.0000 mg | ORAL_TABLET | Freq: Every day | ORAL | Status: DC
  Administered 2016-05-09 – 2016-05-10 (×2): 5 mg via ORAL
  Filled 2016-05-08 (×2): qty 1

## 2016-05-08 MED ORDER — LABETALOL HCL 5 MG/ML IV SOLN
20.0000 mg | Freq: Once | INTRAVENOUS | Status: AC
  Administered 2016-05-08: 20 mg via INTRAVENOUS
  Filled 2016-05-08: qty 4

## 2016-05-08 MED ORDER — SODIUM CHLORIDE 0.9 % IV SOLN
INTRAVENOUS | Status: DC
  Administered 2016-05-08 – 2016-05-09 (×2): 125 mL/h via INTRAVENOUS
  Administered 2016-05-10: 06:00:00 via INTRAVENOUS

## 2016-05-08 MED ORDER — LISINOPRIL 20 MG PO TABS
20.0000 mg | ORAL_TABLET | Freq: Every day | ORAL | Status: DC
  Administered 2016-05-08 – 2016-05-10 (×3): 20 mg via ORAL
  Filled 2016-05-08 (×3): qty 1

## 2016-05-08 MED ORDER — ENOXAPARIN SODIUM 40 MG/0.4ML ~~LOC~~ SOLN
40.0000 mg | SUBCUTANEOUS | Status: DC
  Administered 2016-05-08 – 2016-05-09 (×2): 40 mg via SUBCUTANEOUS
  Filled 2016-05-08 (×2): qty 0.4

## 2016-05-08 MED ORDER — SODIUM CHLORIDE 0.9 % IV BOLUS (SEPSIS)
1000.0000 mL | Freq: Once | INTRAVENOUS | Status: AC
  Administered 2016-05-08: 1000 mL via INTRAVENOUS

## 2016-05-08 MED ORDER — DEXTROSE-NACL 5-0.45 % IV SOLN
INTRAVENOUS | Status: AC
  Administered 2016-05-08: 17:00:00 via INTRAVENOUS

## 2016-05-08 MED ORDER — HYDRALAZINE HCL 20 MG/ML IJ SOLN
10.0000 mg | INTRAMUSCULAR | Status: DC | PRN
  Administered 2016-05-09: 10 mg via INTRAVENOUS
  Filled 2016-05-08: qty 1

## 2016-05-08 MED ORDER — MORPHINE SULFATE (PF) 4 MG/ML IV SOLN
4.0000 mg | Freq: Once | INTRAVENOUS | Status: AC
  Administered 2016-05-08: 4 mg via INTRAVENOUS
  Filled 2016-05-08: qty 1

## 2016-05-08 MED ORDER — ONDANSETRON HCL 4 MG/2ML IJ SOLN
4.0000 mg | Freq: Once | INTRAMUSCULAR | Status: AC
  Administered 2016-05-08: 4 mg via INTRAVENOUS
  Filled 2016-05-08: qty 2

## 2016-05-08 MED ORDER — HYDROCODONE-ACETAMINOPHEN 5-325 MG PO TABS
1.0000 | ORAL_TABLET | Freq: Four times a day (QID) | ORAL | Status: DC | PRN
  Administered 2016-05-08 (×2): 1 via ORAL
  Administered 2016-05-09 – 2016-05-10 (×3): 2 via ORAL
  Filled 2016-05-08 (×2): qty 2
  Filled 2016-05-08 (×2): qty 1
  Filled 2016-05-08: qty 2

## 2016-05-08 MED ORDER — HYDROMORPHONE HCL 1 MG/ML IJ SOLN
1.0000 mg | Freq: Once | INTRAMUSCULAR | Status: AC
  Administered 2016-05-08: 1 mg via INTRAVENOUS
  Filled 2016-05-08: qty 1

## 2016-05-08 MED ORDER — HYDROCODONE-ACETAMINOPHEN 5-325 MG PO TABS: 1.0000 | ORAL_TABLET | Freq: Four times a day (QID) | ORAL | Status: DC | PRN

## 2016-05-08 MED ORDER — LABETALOL HCL 5 MG/ML IV SOLN
10.0000 mg | Freq: Once | INTRAVENOUS | Status: AC
  Administered 2016-05-08: 10 mg via INTRAVENOUS
  Filled 2016-05-08: qty 4

## 2016-05-08 NOTE — ED Notes (Signed)
Patient transported to CT 

## 2016-05-08 NOTE — ED Notes (Signed)
Patient states he has a h/o diverticulitis and this feels similar.  Pain started yesterday afternoon and has continued through the evening.  Pain is increasing and is specifically in the LLQ and RLQ.  Patient in NAD at this time with even and unlabored respirations.

## 2016-05-08 NOTE — ED Notes (Addendum)
Radiology at bedside giving instructions on CT contrast.

## 2016-05-08 NOTE — ED Notes (Signed)
C/o abd pain, n/d started yesterday-NAD-steady gait

## 2016-05-08 NOTE — ED Provider Notes (Signed)
CSN: [id number]     Arrival date & time 05/08/16  1221 History   First MD Initiated Contact with Patient 05/08/16 1236     Chief Complaint  Patient presents with  . Abdominal Pain     (Consider location/radiation/quality/duration/timing/severity/associated sxs/prior Treatment) HPI   LLQ abdominal pain, feels like something about to rip, and squeezing pain. Associated nausea. Started last night and pain 9/10. Hx of diverticulitis and feels this way.  Small amt diarrhea but felt like needed to force it out.  No dysuria.   Chart review shows 4 CT scans in the last year at various hospitals. all without acute findings. Had perforated Diverticulitis at [facility] 07/24/2009, recurrent diverticulitis 2011, and many negative CT abdomen/pelvis since 2011.   Had sigmoid resection    Past Medical History  Diagnosis Date  . Diverticulitis   . Hypertension    History reviewed. No pertinent past surgical history. No family history on file. Social History  Substance Use Topics  . Smoking status: Current Some Day Smoker  . Smokeless tobacco: None  . Alcohol Use: Yes     Comment: weekly    Review of Systems  Constitutional: Positive for chills. Negative for fever.  HENT: Negative for sore throat.   Eyes: Negative for visual disturbance.  Respiratory: Negative for cough and shortness of breath.   Cardiovascular: Negative for chest pain.  Gastrointestinal: Positive for nausea, abdominal pain and diarrhea. Negative for vomiting, constipation and blood in stool.  Genitourinary: Negative for dysuria and difficulty urinating.  Musculoskeletal: Positive for arthralgias (on left side arm and leg). Negative for back pain and neck stiffness.  Skin: Negative for rash.  Neurological: Positive for light-headedness and headaches. Negative for syncope.      Allergies  Shellfish allergy and Contrast media  Home Medications   Prior to Admission medications   Not on File   BP 175/124 mmHg  Pulse  74  Temp(Src) 98.7 F (37.1 C) (Oral)  Resp 20  Ht  (1.956 m)  Wt 260 lb (117.935 kg)  BMI 30.83 kg/m2  SpO2 99% Physical Exam  Constitutional: He is oriented to person, place, and time. He appears well-developed and well-nourished. No distress.  HENT:  Head: Normocephalic and atraumatic.  Eyes: Conjunctivae and EOM are normal.  Neck: Normal range of motion.  Cardiovascular: Normal rate, regular rhythm, normal heart sounds and intact distal pulses.  Exam reveals no gallop and no friction rub.   No murmur heard. Pulmonary/Chest: Effort normal and breath sounds normal. No respiratory distress. He has no wheezes. He has no rales.  Abdominal: Soft. He exhibits no distension. There is tenderness (LLQ, LUQ). There is no guarding.  Musculoskeletal: He exhibits no edema.  Neurological: He is alert and oriented to person, place, and time. He has normal strength. No cranial nerve deficit (tested and normal) or sensory deficit. Coordination normal. GCS eye subscore is 4. GCS verbal subscore is 5. GCS motor subscore is 6.  Skin: Skin is warm and dry. He is not diaphoretic.  Nursing note and vitals reviewed.   ED Course  Procedures (including critical care time) Labs Review Labs Reviewed  URINALYSIS, ROUTINE W REFLEX MICROSCOPIC (NOT AT [facility]) - Abnormal; Notable for the following:    Ketones, ur 15 (*)    Leukocytes, UA TRACE (*)    All other components within normal limits  URINE MICROSCOPIC-ADD ON - Abnormal; Notable for the following:    Squamous [id number] / LPF 0-5 (*)    Bacteria, UA MANY (*)  Casts HYALINE CASTS (*)    All other components within normal limits  CBC WITH DIFFERENTIAL/PLATELET - Abnormal; Notable for the following:    RDW 15.9 (*)    Monocytes Absolute 1.1 (*)    All other components within normal limits  URINE CULTURE  COMPREHENSIVE METABOLIC PANEL  LIPASE, BLOOD    Imaging Review Ct Abdomen Pelvis Wo Contrast  05/08/2016  CLINICAL DATA:  Left side  abdominal pain beginning last night now radiating into the right upper quadrant. Diarrhea and constipation. EXAM: CT ABDOMEN AND PELVIS WITHOUT CONTRAST TECHNIQUE: Multidetector CT imaging of the abdomen and pelvis was performed following the standard protocol without IV contrast. COMPARISON:  CT abdomen and pelvis 01/21/2016. FINDINGS: Heart size is upper normal. No pleural or pericardial effusion. Mild dependent atelectatic change is noted the lung bases. Proximal small bowel loops are dilated up to 3.5 cm. Transition point is seen in the left upper quadrant the abdomen where loops appear adhesed together. More distal loops are completely decompressed. Surgical anastomosis at the rectosigmoid junction consistent with partial sigmoid resection is noted and unchanged. Scattered diverticular seen in the colon. No evidence of diverticulitis. The appendix appears normal. No pneumatosis, portal venous gas or free intraperitoneal air is identified. There is no fluid collection. The kidneys, liver, gallbladder, adrenal glands, spleen and pancreas are unremarkable. No lymphadenopathy is seen. No lytic or sclerotic bony lesion is identified. Prominent accessory ossicles about the acetabuli bilaterally are noted. IMPRESSION: Findings compatible with partial or early small bowel obstruction which appears to be due to adhesions. Transition point is likely in the left upper quadrant of the abdomen where small bowel loops appear adhesed together. No evidence of bowel ischemia. Diverticulosis without diverticulitis. Postoperative change sigmoid colon noted. Electronically Signed   By: Drusilla Kannerhomas  Dalessio M.D.   On: 05/08/2016 15:49   I have personally reviewed and evaluated these images and lab results as part of my medical decision-making.   EKG Interpretation None      MDM   Final diagnoses:  Partial small bowel obstruction (HCC)   35 year old male with a history of hypertension and diverticulitis status post sigmoid  resection per patient, presents with concern of left-sided abdominal pain.  Chart review shows patient has had multiple emergency department visits for both chest pain and abdominal pain at several different facilities, and has had 4 normal CT scans in the past year, however given the severity of pain described, and history of diverticulitis and surgery, CT was obtained. CT abdomen and pelvis shows evidence of early or partial small bowel obstruction with transition point in the left upper quadrant. Patient is not having emesis, reports difficulty having bowel movements, however reports episodes of diarrhea, and that he is passing flatus--however, given this is the area that patient describes pain, and the character of his pain, could be consistent with bowel obstruction, well admit patient for observation for likely early bowel obstruction. Is a Dr. Derrell LollingIngram surgery who recommends surgical consult when patient is an inpatient if he worsens or if it's clinically indicated. We agree that NG tube is not indicated at this time, given patient without emesis.  Will place on IV fluids, make NPO and have pt admitted to hospitalist for further observation. Patient also with signs of possible urinary tract infection, was given a dose of Rocephin in the emergency department. Pt with htn, off his lisinopril for last 2 weeks, no symptoms of htn emergency. Will give labetalol.      Alvira MondayErin Olajuwon Fosdick, MD 05/08/16  1715 

## 2016-05-08 NOTE — Progress Notes (Signed)
BP very high. Norvasc 5mg  (chart says 10mg  at home but needs review) given tonight. Hydralazine ordered prn. Lisinopril 20mg  (home med) started now as well. KJKG, NP [facility]

## 2016-05-08 NOTE — ED Notes (Signed)
MD at bedside. 

## 2016-05-08 NOTE — H&P (Signed)
History and Physical    Colon Rueth ZOX:[id number] DOB: June 20, 1981 DOA: 05/08/2016   PCP: No primary care provider on file. Chief Complaint:  Chief Complaint  Patient presents with  . Abdominal Pain    HPI: Joshua Patel is a 35 y.o. male with medical history significant of Diverticulitis with perforated diverticulum requiring partial colectomy, frequent ED visits for chronic abdominal pain, HTN.  Patient presents to the ED with c/o LUQ abdominal pain.  Symptoms onset last night, last BM was this morning, squeezing quality pain, 9/10 intensity.  Nothing makes it better or worse.    ED Course: PSBO with LUQ adhesions.  Review of Systems: As per HPI otherwise 10 point review of systems negative.    Past Medical History  Diagnosis Date  . Diverticulitis 2010, 2011  . Hypertension     Past Surgical History  Procedure Laterality Date  . Partial colectomy       reports that he has been smoking.  He does not have any smokeless tobacco history on file. He reports that he drinks alcohol. He reports that he does not use illicit drugs.  Allergies  Allergen Reactions  . Shellfish Allergy Shortness Of Breath  . Contrast Media [Iodinated Diagnostic Agents] Itching    Patient Immediately started itching face and left arm following contrast injection. Patient states he is also allergic to shellfish but has tolerated CT contrast in the past.    No family history on file. No history of diverticulitis.   Prior to Admission medications   Not on File    Physical Exam: Filed Vitals:   05/08/16 1800 05/08/16 1807 05/08/16 1820 05/08/16 1912  BP: 178/131 180/122 167/116 173/114  Pulse: 75  78 60  Temp:   98 F (36.7 C) 98.3 F (36.8 C)  TempSrc:   Oral Oral  Resp:    20  Height:      Weight:      SpO2: 96%  98% 98%      Constitutional: NAD, calm, comfortable Eyes: PERRL, lids and conjunctivae normal ENMT: Mucous membranes are moist. Posterior pharynx clear of any exudate  or lesions.Normal dentition.  Neck: normal, supple, no masses, no thyromegaly Respiratory: clear to auscultation bilaterally, no wheezing, no crackles. Normal respiratory effort. No accessory muscle use.  Cardiovascular: Regular rate and rhythm, no murmurs / rubs / gallops. No extremity edema. 2+ pedal pulses. No carotid bruits.  Abdomen: no tenderness, no masses palpated. No hepatosplenomegaly. Bowel sounds positive.  Musculoskeletal: no clubbing / cyanosis. No joint deformity upper and lower extremities. Good ROM, no contractures. Normal muscle tone.  Skin: no rashes, lesions, ulcers. No induration Neurologic: CN 2-12 grossly intact. Sensation intact, DTR normal. Strength 5/5 in all 4.  Psychiatric: Normal judgment and insight. Alert and oriented x 3. Normal mood.    Labs on Admission: I have personally reviewed following labs and imaging studies  CBC:  Recent Labs Lab 05/08/16 1325  WBC 6.2  NEUTROABS 2.6  HGB 13.4  HCT 39.4  MCV 84.7  PLT 264   Basic Metabolic Panel:  Recent Labs Lab 05/08/16 1325  NA 140  K 3.8  CL 104  CO2 28  GLUCOSE 98  BUN 11  CREATININE 1.01  CALCIUM 9.2   GFR: Estimated Creatinine Clearance: 146.6 mL/min (by C-G formula based on Cr of 1.01). Liver Function Tests:  Recent Labs Lab 05/08/16 1325  AST 27  ALT 36  ALKPHOS 62  BILITOT 0.4  PROT 8.0  ALBUMIN 4.0  Recent Labs Lab 05/08/16 1325  LIPASE 27   No results for input(s): AMMONIA in the last 168 hours. Coagulation Profile: No results for input(s): INR, PROTIME in the last 168 hours. Cardiac Enzymes: No results for input(s): CKTOTAL, CKMB, CKMBINDEX, TROPONINI in the last 168 hours. BNP (last 3 results) No results for input(s): PROBNP in the last 8760 hours. HbA1C: No results for input(s): HGBA1C in the last 72 hours. CBG: No results for input(s): GLUCAP in the last 168 hours. Lipid Profile: No results for input(s): CHOL, HDL, LDLCALC, TRIG, CHOLHDL, LDLDIRECT in  the last 72 hours. Thyroid Function Tests: No results for input(s): TSH, T4TOTAL, FREET4, T3FREE, THYROIDAB in the last 72 hours. Anemia Panel: No results for input(s): VITAMINB12, FOLATE, FERRITIN, TIBC, IRON, RETICCTPCT in the last 72 hours. Urine analysis:    Component Value Date/Time   COLORURINE YELLOW 05/08/2016 1230   APPEARANCEUR CLEAR 05/08/2016 1230   LABSPEC 1.029 05/08/2016 1230   PHURINE 5.5 05/08/2016 1230   GLUCOSEU NEGATIVE 05/08/2016 1230   HGBUR NEGATIVE 05/08/2016 1230   BILIRUBINUR NEGATIVE 05/08/2016 1230   KETONESUR 15* 05/08/2016 1230   PROTEINUR NEGATIVE 05/08/2016 1230   UROBILINOGEN 0.2 10/20/2012 1955   NITRITE NEGATIVE 05/08/2016 1230   LEUKOCYTESUR TRACE* 05/08/2016 1230   Sepsis Labs: @LABRCNTIP (procalcitonin:4,lacticidven:4) )No results found for this or any previous visit (from the past 240 hour(s)).   Radiological Exams on Admission: Ct Abdomen Pelvis Wo Contrast  05/08/2016  CLINICAL DATA:  Left side abdominal pain beginning last night now radiating into the right upper quadrant. Diarrhea and constipation. EXAM: CT ABDOMEN AND PELVIS WITHOUT CONTRAST TECHNIQUE: Multidetector CT imaging of the abdomen and pelvis was performed following the standard protocol without IV contrast. COMPARISON:  CT abdomen and pelvis 01/21/2016. FINDINGS: Heart size is upper normal. No pleural or pericardial effusion. Mild dependent atelectatic change is noted the lung bases. Proximal small bowel loops are dilated up to 3.5 cm. Transition point is seen in the left upper quadrant the abdomen where loops appear adhesed together. More distal loops are completely decompressed. Surgical anastomosis at the rectosigmoid junction consistent with partial sigmoid resection is noted and unchanged. Scattered diverticular seen in the colon. No evidence of diverticulitis. The appendix appears normal. No pneumatosis, portal venous gas or free intraperitoneal air is identified. There is no  fluid collection. The kidneys, liver, gallbladder, adrenal glands, spleen and pancreas are unremarkable. No lymphadenopathy is seen. No lytic or sclerotic bony lesion is identified. Prominent accessory ossicles about the acetabuli bilaterally are noted. IMPRESSION: Findings compatible with partial or early small bowel obstruction which appears to be due to adhesions. Transition point is likely in the left upper quadrant of the abdomen where small bowel loops appear adhesed together. No evidence of bowel ischemia. Diverticulosis without diverticulitis. Postoperative change sigmoid colon noted. Electronically Signed   By: Drusilla Kanner M.D.   On: 05/08/2016 15:49    EKG: Independently reviewed.  Assessment/Plan Principal Problem:   Partial small bowel obstruction (HCC) Active Problems:   HTN (hypertension)    PSBO -  No vomiting so will hold off on NGT for now  Minimize narcotics, will order PRN norco for severe pain  NPO  IVF  Repeat labs in AM  HTN -  Ordering norvasc to start tomorrow AM  BP currently 120s systolic after large amount of IV labetalol in ED   DVT prophylaxis: Lovenox Code Status: Full Family Communication: Family at bedside Consults called: Surgery called by EDP Admission status: Admit to inpatient  Hillary BowGARDNER, JARED M. DO [facility] Pager [phone] from 7PM-7AM  If 7AM-7PM, please contact the day physician for the patient [email or web address] Password [email or web address]  05/08/2016, 7:59 PM

## 2016-05-09 ENCOUNTER — Inpatient Hospital Stay (HOSPITAL_COMMUNITY): Payer: Managed Care, Other (non HMO)

## 2016-05-09 ENCOUNTER — Observation Stay (HOSPITAL_COMMUNITY): Payer: Managed Care, Other (non HMO)

## 2016-05-09 DIAGNOSIS — K5669 Other intestinal obstruction: Secondary | ICD-10-CM

## 2016-05-09 LAB — BASIC METABOLIC PANEL
Anion gap: 7 (ref 5–15)
BUN: 9 mg/dL (ref 6–20)
CALCIUM: 9.1 mg/dL (ref 8.9–10.3)
CHLORIDE: 105 mmol/L (ref 101–111)
CO2: 25 mmol/L (ref 22–32)
CREATININE: 0.89 mg/dL (ref 0.61–1.24)
GFR calc non Af Amer: >60 mL/min (ref >60)
GLUCOSE: 95 mg/dL (ref 65–99)
Potassium: 3.8 mmol/L (ref 3.5–5.1)
Sodium: 137 mmol/L (ref 135–145)

## 2016-05-09 LAB — CBC
HEMATOCRIT: 40.2 % (ref 39.0–52.0)
HEMOGLOBIN: 13.5 g/dL (ref 13.0–17.0)
MCH: 28.7 pg (ref 26.0–34.0)
MCHC: 33.6 g/dL (ref 30.0–36.0)
MCV: 85.4 fL (ref 78.0–100.0)
Platelets: 263 10*3/uL (ref 150–400)
RBC: 4.71 MIL/uL (ref 4.22–5.81)
RDW: 16 % — AB (ref 11.5–15.5)
WBC: 7.2 10*3/uL (ref 4.0–10.5)

## 2016-05-09 LAB — SURGICAL PCR SCREEN
MRSA, PCR: POSITIVE — AB
Staphylococcus aureus: POSITIVE — AB

## 2016-05-09 MED ORDER — DIPHENHYDRAMINE HCL 25 MG PO CAPS
25.0000 mg | ORAL_CAPSULE | Freq: Once | ORAL | Status: AC
  Administered 2016-05-09: 25 mg via ORAL
  Filled 2016-05-09: qty 1

## 2016-05-09 MED ORDER — CHLORHEXIDINE GLUCONATE CLOTH 2 % EX PADS
6.0000 | MEDICATED_PAD | Freq: Every day | CUTANEOUS | Status: DC
  Administered 2016-05-09 – 2016-05-10 (×2): 6 via TOPICAL

## 2016-05-09 MED ORDER — MORPHINE SULFATE (PF) 2 MG/ML IV SOLN
1.0000 mg | Freq: Once | INTRAVENOUS | Status: AC
  Administered 2016-05-09: 1 mg via INTRAVENOUS
  Filled 2016-05-09: qty 1

## 2016-05-09 MED ORDER — MUPIROCIN 2 % EX OINT
1.0000 "application " | TOPICAL_OINTMENT | Freq: Two times a day (BID) | CUTANEOUS | Status: DC
  Administered 2016-05-09 – 2016-05-10 (×4): 1 via NASAL
  Filled 2016-05-09: qty 22

## 2016-05-09 NOTE — Progress Notes (Signed)
PROGRESS NOTE  Joshua Patel ZOX:[id number] DOB: December 23, 1980 DOA: 05/08/2016 PCP: No primary care provider on file.     Brief Narrative: 35 y.o. male with medical history significant of Diverticulitis with perforated diverticulum requiring partial colectomy, frequent ED visits for chronic abdominal pain, HTN. Patient presents to the ED 7/11 with c/o LUQ abdominal pain, found to have pSBO on CT. EDP discussed with general surgery who recommended medical admission and consult surgery if no improvement   Assessment & Plan: Principal Problem:   Partial small bowel obstruction (HCC) Active Problems:   HTN (hypertension)   pSBO - no vomiting and no NG tube placed - patient passing gas this morning and had a BM, repeat abdominal XR this morning with non - obstructive bowel gas pattern - advance diet  HTN - currently on Norvasc and Lisinopril   DVT prophylaxis: Lovenox Code Status: Full Family Communication: friend bedside Disposition Plan: home in 1 day if tolerating diet  Consultants:   None   Procedures:   None   Antimicrobials:  None    Subjective: - still has pain but feeling better.   Objective: Filed Vitals:   05/08/16 2209 05/08/16 2328 05/09/16 0602 05/09/16 1034  BP: 174/120 169/108 149/101 155/105  Pulse: 63 66 59   Temp:   97.5 F (36.4 C)   TempSrc:      Resp:  18 16   Height:      Weight:      SpO2:  100% 100%     Intake/Output Summary (Last 24 hours) at 05/09/16 1118 Last data filed at 05/09/16 0603  Gross per 24 hour  Intake      0 ml  Output      2 ml  Net     -2 ml   Filed Weights   05/08/16 1227  Weight: 117.935 kg (260 lb)    Examination: Constitutional: NAD Filed Vitals:   05/08/16 2209 05/08/16 2328 05/09/16 0602 05/09/16 1034  BP: 174/120 169/108 149/101 155/105  Pulse: 63 66 59   Temp:   97.5 F (36.4 C)   TempSrc:      Resp:  18 16   Height:      Weight:      SpO2:  100% 100%    Respiratory: clear to auscultation  bilaterally, no wheezing, no crackles. Normal respiratory effort. No accessory muscle use.  Cardiovascular: Regular rate and rhythm, no murmurs / rubs / gallops. No LE edema. 2+ pedal pulses. No carotid bruits.  Abdomen: mild tenderness throughout. Bowel sounds positive.     Data Reviewed: I have personally reviewed following labs and imaging studies  CBC:  Recent Labs Lab 05/08/16 1325 05/09/16 0446  WBC 6.2 7.2  NEUTROABS 2.6  --   HGB 13.4 13.5  HCT 39.4 40.2  MCV 84.7 85.4  PLT 264 263   Basic Metabolic Panel:  Recent Labs Lab 05/08/16 1325 05/09/16 0446  NA 140 137  K 3.8 3.8  CL 104 105  CO2 28 25  GLUCOSE 98 95  BUN 11 9  CREATININE 1.01 0.89  CALCIUM 9.2 9.1   GFR: Estimated Creatinine Clearance: 166.4 mL/min (by C-G formula based on Cr of 0.89). Liver Function Tests:  Recent Labs Lab 05/08/16 1325  AST 27  ALT 36  ALKPHOS 62  BILITOT 0.4  PROT 8.0  ALBUMIN 4.0    Recent Labs Lab 05/08/16 1325  LIPASE 27   No results for input(s): AMMONIA in the last 168 hours. Coagulation Profile: No  results for input(s): INR, PROTIME in the last 168 hours. Cardiac Enzymes: No results for input(s): CKTOTAL, CKMB, CKMBINDEX, TROPONINI in the last 168 hours. BNP (last 3 results) No results for input(s): PROBNP in the last 8760 hours. HbA1C: No results for input(s): HGBA1C in the last 72 hours. CBG: No results for input(s): GLUCAP in the last 168 hours. Lipid Profile: No results for input(s): CHOL, HDL, LDLCALC, TRIG, CHOLHDL, LDLDIRECT in the last 72 hours. Thyroid Function Tests: No results for input(s): TSH, T4TOTAL, FREET4, T3FREE, THYROIDAB in the last 72 hours. Anemia Panel: No results for input(s): VITAMINB12, FOLATE, FERRITIN, TIBC, IRON, RETICCTPCT in the last 72 hours. Urine analysis:    Component Value Date/Time   COLORURINE YELLOW 05/08/2016 1230   APPEARANCEUR CLEAR 05/08/2016 1230   LABSPEC 1.029 05/08/2016 1230   PHURINE 5.5  05/08/2016 1230   GLUCOSEU NEGATIVE 05/08/2016 1230   HGBUR NEGATIVE 05/08/2016 1230   BILIRUBINUR NEGATIVE 05/08/2016 1230   KETONESUR 15* 05/08/2016 1230   PROTEINUR NEGATIVE 05/08/2016 1230   UROBILINOGEN 0.2 10/20/2012 1955   NITRITE NEGATIVE 05/08/2016 1230   LEUKOCYTESUR TRACE* 05/08/2016 1230   Sepsis Labs: Invalid input(s): PROCALCITONIN, LACTICIDVEN  Recent Results (from the past 240 hour(s))  Surgical pcr screen     Status: Abnormal   Collection Time: 05/08/16 11:20 PM  Result Value Ref Range Status   MRSA, PCR POSITIVE (A) NEGATIVE Final    Comment: RESULT CALLED TO, READ BACK BY AND VERIFIED WITH: S.WRIGHT RN [id number] A.[email or web address]    Staphylococcus aureus POSITIVE (A) NEGATIVE Final    Comment:        The Xpert SA Assay (FDA approved for NASAL specimens in patients over 59 years of age), is one component of a comprehensive surveillance program.  Test performance has been validated by [facility] for patients greater than or equal to 100 year old. It is not intended to diagnose infection nor to guide or monitor treatment.       Radiology Studies: Ct Abdomen Pelvis Wo Contrast  05/08/2016  CLINICAL DATA:  Left side abdominal pain beginning last night now radiating into the right upper quadrant. Diarrhea and constipation. EXAM: CT ABDOMEN AND PELVIS WITHOUT CONTRAST TECHNIQUE: Multidetector CT imaging of the abdomen and pelvis was performed following the standard protocol without IV contrast. COMPARISON:  CT abdomen and pelvis 01/21/2016. FINDINGS: Heart size is upper normal. No pleural or pericardial effusion. Mild dependent atelectatic change is noted the lung bases. Proximal small bowel loops are dilated up to 3.5 cm. Transition point is seen in the left upper quadrant the abdomen where loops appear adhesed together. More distal loops are completely decompressed. Surgical anastomosis at the rectosigmoid junction consistent with partial sigmoid resection is noted and  unchanged. Scattered diverticular seen in the colon. No evidence of diverticulitis. The appendix appears normal. No pneumatosis, portal venous gas or free intraperitoneal air is identified. There is no fluid collection. The kidneys, liver, gallbladder, adrenal glands, spleen and pancreas are unremarkable. No lymphadenopathy is seen. No lytic or sclerotic bony lesion is identified. Prominent accessory ossicles about the acetabuli bilaterally are noted. IMPRESSION: Findings compatible with partial or early small bowel obstruction which appears to be due to adhesions. Transition point is likely in the left upper quadrant of the abdomen where small bowel loops appear adhesed together. No evidence of bowel ischemia. Diverticulosis without diverticulitis. Postoperative change sigmoid colon noted. Electronically Signed   By: Drusilla Kanner M.D.   On: 05/08/2016 15:49   Dg Abd 2  Views  05/09/2016  CLINICAL DATA:  Follow-up small bowel obstruction. EXAM: ABDOMEN - 2 VIEW COMPARISON:  Seven hundred eleven CT FINDINGS: There is residual contrast in nondilated loops of colon. No dilated loops of small bowel are identified. Paucity of small bowel gas is noted however. No evidence for free intraperitoneal air. IMPRESSION: Nonobstructive bowel gas pattern. Electronically Signed   By: Norva PavlovElizabeth  Brown M.D.   On: 05/09/2016 08:45     Scheduled Meds: . amLODipine  5 mg Oral Daily  . Chlorhexidine Gluconate Cloth  6 each Topical Q0600  . enoxaparin (LOVENOX) injection  40 mg Subcutaneous Q24H  . lisinopril  20 mg Oral Daily  . mupirocin ointment  1 application Nasal BID   Continuous Infusions: . sodium chloride 125 mL/hr (05/09/16 0900)    Pamella Pertostin Lira Stephen, MD, PhD [facility] Pager [phone]  If 7PM-7AM, please contact night-coverage [email or web address] Password [email or web address] 05/09/2016, 11:18 AM

## 2016-05-10 DIAGNOSIS — K5669 Other intestinal obstruction: Secondary | ICD-10-CM | POA: Diagnosis not present

## 2016-05-10 LAB — URINE CULTURE: Culture: NO GROWTH

## 2016-05-10 MED ORDER — HYDROCODONE-ACETAMINOPHEN 5-325 MG PO TABS: 1.0000 | ORAL_TABLET | Freq: Four times a day (QID) | ORAL | Status: DC | PRN

## 2016-05-10 MED ORDER — AMLODIPINE BESYLATE 10 MG PO TABS: 10.0000 mg | ORAL_TABLET | Freq: Every day | ORAL | Status: DC

## 2016-05-10 MED ORDER — LISINOPRIL 20 MG PO TABS: 20.0000 mg | ORAL_TABLET | Freq: Every day | ORAL | Status: DC

## 2016-05-10 NOTE — Discharge Summary (Signed)
Physician Discharge Summary  Sinclair ShipLaviurs Nourse ZOX:[id number] DOB: 1981/02/27 DOA: 05/08/2016  PCP: No primary care provider on file.  Admit date: 05/08/2016 Discharge date: 05/10/2016  Admitted From: home Disposition:  home  Recommendations for Outpatient Follow-up:  1. Follow up with PCP in 1-2 weeks  Discharge Condition: stable CODE STATUS: Full Diet recommendation: regular  HPI: 35 y.o. male with medical history significant of Diverticulitis with perforated diverticulum requiring partial colectomy, frequent ED visits for chronic abdominal pain, HTN. Patient presents to the ED 7/11 with c/o LUQ abdominal pain, found to have pSBO on CT. EDP discussed with general surgery who recommended medical admission and consult surgery if no improvement   Hospital Course: Discharge Diagnoses:  Principal Problem:   Partial small bowel obstruction (HCC) Active Problems:   HTN (hypertension)  pSBO with abdominal pain - no vomiting and no NG tube placed on admission. Patient SBO quickly resolved with conservative management, NPO, IVF. Repeat abdominal XR with non - obstructive bowel gas pattern. His diet was advanced, tolerated well, he had BMs and was feeling back to baseline asking to be d/c. Mild abdominal pain, with some chronic component. Currently in between PCPs, 3 days of pain medications given.  HTN - currently on Norvasc and Lisinopril   Discharge Instructions     Medication List    TAKE these medications        amLODipine 10 MG tablet  Commonly known as:  NORVASC  Take 1 tablet (10 mg total) by mouth daily.     aspirin EC 325 MG tablet  Take 325 mg by mouth daily as needed for moderate pain.     HYDROcodone-acetaminophen 5-325 MG tablet  Commonly known as:  NORCO/VICODIN  Take 1-2 tablets by mouth every 6 (six) hours as needed for severe pain.     lisinopril 20 MG tablet  Commonly known as:  PRINIVIL,ZESTRIL  Take 1 tablet (20 mg total) by mouth daily.         Allergies  Allergen Reactions  . Shellfish Allergy Shortness Of Breath  . Contrast Media [Iodinated Diagnostic Agents] Itching    Patient Immediately started itching face and left arm following contrast injection. Patient states he is also allergic to shellfish but has tolerated CT contrast in the past.    Consultations:  None   Procedures/Studies:  None   Ct Abdomen Pelvis Wo Contrast  05/08/2016  CLINICAL DATA:  Left side abdominal pain beginning last night now radiating into the right upper quadrant. Diarrhea and constipation. EXAM: CT ABDOMEN AND PELVIS WITHOUT CONTRAST TECHNIQUE: Multidetector CT imaging of the abdomen and pelvis was performed following the standard protocol without IV contrast. COMPARISON:  CT abdomen and pelvis 01/21/2016. FINDINGS: Heart size is upper normal. No pleural or pericardial effusion. Mild dependent atelectatic change is noted the lung bases. Proximal small bowel loops are dilated up to 3.5 cm. Transition point is seen in the left upper quadrant the abdomen where loops appear adhesed together. More distal loops are completely decompressed. Surgical anastomosis at the rectosigmoid junction consistent with partial sigmoid resection is noted and unchanged. Scattered diverticular seen in the colon. No evidence of diverticulitis. The appendix appears normal. No pneumatosis, portal venous gas or free intraperitoneal air is identified. There is no fluid collection. The kidneys, liver, gallbladder, adrenal glands, spleen and pancreas are unremarkable. No lymphadenopathy is seen. No lytic or sclerotic bony lesion is identified. Prominent accessory ossicles about the acetabuli bilaterally are noted. IMPRESSION: Findings compatible with partial or early small bowel  obstruction which appears to be due to adhesions. Transition point is likely in the left upper quadrant of the abdomen where small bowel loops appear adhesed together. No evidence of bowel ischemia.  Diverticulosis without diverticulitis. Postoperative change sigmoid colon noted. Electronically Signed   By: Drusilla Kanner M.D.   On: 05/08/2016 15:49   Dg Abd 2 Views  05/09/2016  CLINICAL DATA:  Follow-up small bowel obstruction. EXAM: ABDOMEN - 2 VIEW COMPARISON:  Seven hundred eleven CT FINDINGS: There is residual contrast in nondilated loops of colon. No dilated loops of small bowel are identified. Paucity of small bowel gas is noted however. No evidence for free intraperitoneal air. IMPRESSION: Nonobstructive bowel gas pattern. Electronically Signed   By: Norva Pavlov M.D.   On: 05/09/2016 08:45     Subjective: - no chest pain, shortness of breath, mild abdominal pain, no nausea or vomiting.   Discharge Exam: Filed Vitals:   05/10/16 0955 05/10/16 1054  BP: 155/100 145/90  Pulse:  68  Temp:    Resp:     Filed Vitals:   05/09/16 2135 05/10/16 0557 05/10/16 0955 05/10/16 1054  BP: 158/96 143/110 155/100 145/90  Pulse: 73 100  68  Temp: 99.9 F (37.7 C) 98.2 F (36.8 C)    TempSrc: Oral Oral    Resp: 18 20    Height:      Weight:      SpO2: 99% 98%      General: Pt is alert, awake, not in acute distress Cardiovascular: RRR, S1/S2 +, no rubs, no gallops Respiratory: CTA bilaterally, no wheezing, no rhonchi Abdominal: Soft, mild tenderness, ND, bowel sounds + Extremities: no edema, no cyanosis    The results of significant diagnostics from this hospitalization (including imaging, microbiology, ancillary and laboratory) are listed below for reference.     Microbiology: Recent Results (from the past 240 hour(s))  Urine culture     Status: None   Collection Time: 05/08/16 12:30 PM  Result Value Ref Range Status   Specimen Description URINE, CLEAN CATCH  Final   Special Requests NONE  Final   Culture NO GROWTH Performed at [facility]   Final   Report Status 05/10/2016 FINAL  Final  Surgical pcr screen     Status: Abnormal   Collection Time: 05/08/16  11:20 PM  Result Value Ref Range Status   MRSA, PCR POSITIVE (A) NEGATIVE Final    Comment: RESULT CALLED TO, READ BACK BY AND VERIFIED WITH: S.WRIGHT RN [id number] A.QUIZON    Staphylococcus aureus POSITIVE (A) NEGATIVE Final    Comment:        The Xpert SA Assay (FDA approved for NASAL specimens in patients over 34 years of age), is one component of a comprehensive surveillance program.  Test performance has been validated by [facility] for patients greater than or equal to 40 year old. It is not intended to diagnose infection nor to guide or monitor treatment.      Labs: BNP (last 3 results)  Recent Labs  12/18/15 1945  BNP 4.4   Basic Metabolic Panel:  Recent Labs Lab 05/08/16 1325 05/09/16 0446  NA 140 137  K 3.8 3.8  CL 104 105  CO2 28 25  GLUCOSE 98 95  BUN 11 9  CREATININE 1.01 0.89  CALCIUM 9.2 9.1   Liver Function Tests:  Recent Labs Lab 05/08/16 1325  AST 27  ALT 36  ALKPHOS 62  BILITOT 0.4  PROT 8.0  ALBUMIN 4.0  Recent Labs Lab 05/08/16 1325  LIPASE 27   No results for input(s): AMMONIA in the last 168 hours. CBC:  Recent Labs Lab 05/08/16 1325 05/09/16 0446  WBC 6.2 7.2  NEUTROABS 2.6  --   HGB 13.4 13.5  HCT 39.4 40.2  MCV 84.7 85.4  PLT 264 263   Cardiac Enzymes: No results for input(s): CKTOTAL, CKMB, CKMBINDEX, TROPONINI in the last 168 hours. BNP: Invalid input(s): POCBNP CBG: No results for input(s): GLUCAP in the last 168 hours. D-Dimer No results for input(s): DDIMER in the last 72 hours. Hgb A1c No results for input(s): HGBA1C in the last 72 hours. Lipid Profile No results for input(s): CHOL, HDL, LDLCALC, TRIG, CHOLHDL, LDLDIRECT in the last 72 hours. Thyroid function studies No results for input(s): TSH, T4TOTAL, T3FREE, THYROIDAB in the last 72 hours.  Invalid input(s): FREET3 Anemia work up No results for input(s): VITAMINB12, FOLATE, FERRITIN, TIBC, IRON, RETICCTPCT in the last 72  hours. Urinalysis    Component Value Date/Time   COLORURINE YELLOW 05/08/2016 1230   APPEARANCEUR CLEAR 05/08/2016 1230   LABSPEC 1.029 05/08/2016 1230   PHURINE 5.5 05/08/2016 1230   GLUCOSEU NEGATIVE 05/08/2016 1230   HGBUR NEGATIVE 05/08/2016 1230   BILIRUBINUR NEGATIVE 05/08/2016 1230   KETONESUR 15* 05/08/2016 1230   PROTEINUR NEGATIVE 05/08/2016 1230   UROBILINOGEN 0.2 10/20/2012 1955   NITRITE NEGATIVE 05/08/2016 1230   LEUKOCYTESUR TRACE* 05/08/2016 1230   Sepsis Labs Invalid input(s): PROCALCITONIN,  WBC,  LACTICIDVEN Microbiology Recent Results (from the past 240 hour(s))  Urine culture     Status: None   Collection Time: 05/08/16 12:30 PM  Result Value Ref Range Status   Specimen Description URINE, CLEAN CATCH  Final   Special Requests NONE  Final   Culture NO GROWTH Performed at [facility]   Final   Report Status 05/10/2016 FINAL  Final  Surgical pcr screen     Status: Abnormal   Collection Time: 05/08/16 11:20 PM  Result Value Ref Range Status   MRSA, PCR POSITIVE (A) NEGATIVE Final    Comment: RESULT CALLED TO, READ BACK BY AND VERIFIED WITH: S.WRIGHT RN [id number] A.QUIZON    Staphylococcus aureus POSITIVE (A) NEGATIVE Final    Comment:        The Xpert SA Assay (FDA approved for NASAL specimens in patients over 59 years of age), is one component of a comprehensive surveillance program.  Test performance has been validated by [facility] for patients greater than or equal to 26 year old. It is not intended to diagnose infection nor to guide or monitor treatment.      Time coordinating discharge: Over 30 minutes  SIGNED:  Pamella Pert, MD  [facility] 05/10/2016, 1:52 PM Pager [phone]  If 7PM-7AM, please contact night-coverage [email or web address] Password [email or web address]

## 2016-05-10 NOTE — Discharge Instructions (Signed)
Follow with PCP in 5-7 days ° °Please get a complete blood count and chemistry panel checked by your Primary MD at your next visit, and again as instructed by your Primary MD. Please get your medications reviewed and adjusted by your Primary MD. ° °Please request your Primary MD to go over all Hospital Tests and Procedure/Radiological results at the follow up, please get all Hospital records sent to your Prim MD by signing hospital release before you go home. ° °If you had Pneumonia of Lung problems at the Hospital: °Please get a 2 view Chest X ray done in 6-8 weeks after hospital discharge or sooner if instructed by your Primary MD. ° °If you have Congestive Heart Failure: °Please call your Cardiologist or Primary MD anytime you have any of the following symptoms:  °1) 3 pound weight gain in 24 hours or 5 pounds in 1 week  °2) shortness of breath, with or without a dry hacking cough  °3) swelling in the hands, feet or stomach  °4) if you have to sleep on extra pillows at night in order to breathe ° °Follow cardiac low salt diet and 1.5 lit/day fluid restriction. ° °If you have diabetes °Accuchecks 4 times/day, Once in AM empty stomach and then before each meal. °Log in all results and show them to your primary doctor at your next visit. °If any glucose reading is under 80 or above 300 call your primary MD immediately. ° °If you have Seizure/Convulsions/Epilepsy: °Please do not drive, operate heavy machinery, participate in activities at heights or participate in high speed sports until you have seen by Primary MD or a Neurologist and advised to do so again. ° °If you had Gastrointestinal Bleeding: °Please ask your Primary MD to check a complete blood count within one week of discharge or at your next visit. Your endoscopic/colonoscopic biopsies that are pending at the time of discharge, will also need to followed by your Primary MD. ° °Get Medicines reviewed and adjusted. °Please take all your medications with you  for your next visit with your Primary MD ° °Please request your Primary MD to go over all hospital tests and procedure/radiological results at the follow up, please ask your Primary MD to get all Hospital records sent to his/her office. ° °If you experience worsening of your admission symptoms, develop shortness of breath, life threatening emergency, suicidal or homicidal thoughts you must seek medical attention immediately by calling 911 or calling your MD immediately  if symptoms less severe. ° °You must read complete instructions/literature along with all the possible adverse reactions/side effects for all the Medicines you take and that have been prescribed to you. Take any new Medicines after you have completely understood and accpet all the possible adverse reactions/side effects.  ° °Do not drive or operate heavy machinery when taking Pain medications.  ° °Do not take more than prescribed Pain, Sleep and Anxiety Medications ° °Special Instructions: If you have smoked or chewed Tobacco  in the last 2 yrs please stop smoking, stop any regular Alcohol  and or any Recreational drug use. ° °Wear Seat belts while driving. ° °Please note °You were cared for by a hospitalist during your hospital stay. If you have any questions about your discharge medications or the care you received while you were in the hospital after you are discharged, you can call the unit and asked to speak with the hospitalist on call if the hospitalist that took care of you is not available. Once you are   discharged, your primary care physician will handle any further medical issues. Please note that NO REFILLS for any discharge medications will be authorized once you are discharged, as it is imperative that you return to your primary care physician (or establish a relationship with a primary care physician if you do not have one) for your aftercare needs so that they can reassess your need for medications and monitor your lab values. ° °You  can reach the hospitalist office at phone [phone] or fax [phone] °  °If you do not have a primary care physician, you can call [phone] for a physician referral. ° °Activity: As tolerated with Full fall precautions use walker/cane & assistance as needed ° °Diet: regular ° °Disposition Home ° ° °

## 2016-08-29 ENCOUNTER — Emergency Department (HOSPITAL_COMMUNITY): Payer: Self-pay

## 2016-08-29 ENCOUNTER — Emergency Department (HOSPITAL_COMMUNITY)
Admission: EM | Admit: 2016-08-29 | Discharge: 2016-08-29 | Disposition: A | Discharge status: Home / Self Care | Payer: Self-pay | Attending: Emergency Medicine | Admitting: Emergency Medicine

## 2016-08-29 ENCOUNTER — Encounter (HOSPITAL_COMMUNITY): Payer: Self-pay

## 2016-08-29 DIAGNOSIS — R197 Diarrhea, unspecified: Secondary | ICD-10-CM | POA: Insufficient documentation

## 2016-08-29 DIAGNOSIS — I10 Essential (primary) hypertension: Secondary | ICD-10-CM | POA: Insufficient documentation

## 2016-08-29 DIAGNOSIS — F17200 Nicotine dependence, unspecified, uncomplicated: Secondary | ICD-10-CM | POA: Insufficient documentation

## 2016-08-29 DIAGNOSIS — R1013 Epigastric pain: Secondary | ICD-10-CM

## 2016-08-29 DIAGNOSIS — Z7982 Long term (current) use of aspirin: Secondary | ICD-10-CM | POA: Insufficient documentation

## 2016-08-29 LAB — CBC
HCT: 40.1 % (ref 39.0–52.0)
Hemoglobin: 13.6 g/dL (ref 13.0–17.0)
MCH: 28.8 pg (ref 26.0–34.0)
MCHC: 33.9 g/dL (ref 30.0–36.0)
MCV: 84.8 fL (ref 78.0–100.0)
PLATELETS: 278 10*3/uL (ref 150–400)
RBC: 4.73 MIL/uL (ref 4.22–5.81)
RDW: 16.5 % — ABNORMAL HIGH (ref 11.5–15.5)
WBC: 6.2 10*3/uL (ref 4.0–10.5)

## 2016-08-29 LAB — URINALYSIS, ROUTINE W REFLEX MICROSCOPIC
BILIRUBIN URINE: NEGATIVE
Glucose, UA: NEGATIVE mg/dL
Ketones, ur: NEGATIVE mg/dL
Leukocytes, UA: NEGATIVE
NITRITE: NEGATIVE
PROTEIN: NEGATIVE mg/dL
Specific Gravity, Urine: 1.019 (ref 1.005–1.030)
pH: 6 (ref 5.0–8.0)

## 2016-08-29 LAB — COMPREHENSIVE METABOLIC PANEL
ALK PHOS: 54 U/L (ref 38–126)
ALT: 61 U/L (ref 17–63)
ANION GAP: 8 (ref 5–15)
AST: 43 U/L — ABNORMAL HIGH (ref 15–41)
Albumin: 4.2 g/dL (ref 3.5–5.0)
BILIRUBIN TOTAL: 1.1 mg/dL (ref 0.3–1.2)
BUN: 11 mg/dL (ref 6–20)
CALCIUM: 9.6 mg/dL (ref 8.9–10.3)
CO2: 26 mmol/L (ref 22–32)
CREATININE: 0.85 mg/dL (ref 0.61–1.24)
Chloride: 107 mmol/L (ref 101–111)
GFR calc non Af Amer: >60 mL/min (ref >60)
Glucose, Bld: 104 mg/dL — ABNORMAL HIGH (ref 65–99)
Potassium: 4.3 mmol/L (ref 3.5–5.1)
SODIUM: 141 mmol/L (ref 135–145)
TOTAL PROTEIN: 7.7 g/dL (ref 6.5–8.1)

## 2016-08-29 LAB — URINE MICROSCOPIC-ADD ON

## 2016-08-29 LAB — LIPASE, BLOOD: Lipase: 29 U/L (ref 11–51)

## 2016-08-29 MED ORDER — ONDANSETRON HCL 4 MG/2ML IJ SOLN
4.0000 mg | Freq: Once | INTRAMUSCULAR | Status: AC
  Administered 2016-08-29: 4 mg via INTRAVENOUS
  Filled 2016-08-29: qty 2

## 2016-08-29 MED ORDER — FAMOTIDINE IN NACL 20-0.9 MG/50ML-% IV SOLN
20.0000 mg | Freq: Once | INTRAVENOUS | Status: AC
  Administered 2016-08-29: 20 mg via INTRAVENOUS
  Filled 2016-08-29: qty 50

## 2016-08-29 MED ORDER — SODIUM CHLORIDE 0.9 % IV BOLUS (SEPSIS)
1000.0000 mL | Freq: Once | INTRAVENOUS | Status: AC
  Administered 2016-08-29: 1000 mL via INTRAVENOUS

## 2016-08-29 MED ORDER — FAMOTIDINE 20 MG PO TABS: 20.0000 mg | ORAL_TABLET | Freq: Every day | ORAL | 0 refills | Status: AC

## 2016-08-29 MED ORDER — MORPHINE SULFATE (PF) 4 MG/ML IV SOLN
8.0000 mg | Freq: Once | INTRAVENOUS | Status: AC
  Administered 2016-08-29: 8 mg via INTRAVENOUS
  Filled 2016-08-29: qty 2

## 2016-08-29 MED ORDER — ONDANSETRON HCL 4 MG PO TABS: 4.0000 mg | ORAL_TABLET | Freq: Three times a day (TID) | ORAL | 0 refills | Status: DC | PRN

## 2016-08-29 NOTE — ED Provider Notes (Signed)
[facility] DEPT Provider Note   CSN: [id number] Arrival date & time: 08/29/16  [id number]     History   Chief Complaint Chief Complaint  Patient presents with  . Abdominal Pain  . Diarrhea    HPI Joshua Patel is a 35 y.o. male.  HPI  35 year old male who presents with epigastric abdominal pain. He has a history of perforated diverticulitis status post partial colectomy in 2010. Admitted into the hospital recently in July 2017 for partial small bowel obstruction. States yesterday at 8 PM developed epigastric pain, twisting and throbbing in nature. States that this is different than the pain that he felt with this diverticulitis or bowel obstruction. Associated with nausea, but denies vomiting. Has had 3 episodes of diarrhea. No fevers or chills, dysuria or urinary frequency, chest pain or difficulty breathing. No known sick contacts. Has been taking aspirin for pain, without improvement.  Past Medical History:  Diagnosis Date  . Diverticulitis 2010, 2011  . Hypertension     Patient Active Problem List   Diagnosis Date Noted  . Partial small bowel obstruction 05/08/2016  . HTN (hypertension) 05/08/2016    Past Surgical History:  Procedure Laterality Date  . PARTIAL COLECTOMY  2010       Home Medications    Prior to Admission medications   Medication Sig Start Date End Date Taking? Authorizing Provider  amLODipine (NORVASC) 10 MG tablet Take 1 tablet (10 mg total) by mouth daily. 05/10/16  Yes Costin Otelia SergeantM Gherghe, MD  aspirin EC 325 MG tablet Take 325 mg by mouth daily as needed for moderate pain.   Yes Historical Provider, MD  HYDROcodone-acetaminophen (NORCO/VICODIN) 5-325 MG tablet Take 1-2 tablets by mouth every 6 (six) hours as needed for severe pain. 05/10/16  Yes Costin Otelia SergeantM Gherghe, MD  lisinopril (PRINIVIL,ZESTRIL) 20 MG tablet Take 1 tablet (20 mg total) by mouth daily. 05/10/16 05/10/17 Yes Costin Otelia SergeantM Gherghe, MD  famotidine (PEPCID) 20 MG tablet Take 1 tablet (20 mg  total) by mouth daily. 08/29/16   Lavera Guiseana Duo Liu, MD  ondansetron (ZOFRAN) 4 MG tablet Take 1 tablet (4 mg total) by mouth every 8 (eight) hours as needed for nausea or vomiting. 08/29/16   Lavera Guiseana Duo Liu, MD    Family History No family history on file.  Social History Social History  Substance Use Topics  . Smoking status: Current Some Day Smoker  . Smokeless tobacco: Not on file  . Alcohol use Yes     Comment: weekly     Allergies   Shellfish allergy and Contrast media [iodinated diagnostic agents]   Review of Systems Review of Systems 10/14 systems reviewed and are negative other than those stated in the HPI   Physical Exam Updated Vital Signs BP (!) 146/110 (BP Location: Right Arm)   Pulse 90   Temp 98.2 F (36.8 C) (Oral)   Resp 18   Ht 6\' 5"  (1.956 m)   Wt 260 lb (117.9 kg)   SpO2 99%   BMI 30.83 kg/m   Physical Exam Physical Exam  Nursing note and vitals reviewed. Constitutional: Well developed, well nourished, non-toxic, and in no acute distress Head: Normocephalic and atraumatic.  Mouth/Throat: Oropharynx is clear and moist.  Neck: Normal range of motion. Neck supple.  Cardiovascular: Normal rate and regular rhythm.   Pulmonary/Chest: Effort normal and breath sounds normal.  Abdominal: Soft. No distension.There is LUQ and epigastric tenderness. There is no rebound and no guarding.  Musculoskeletal: Normal range of motion.  Neurological: Alert,  no facial droop, fluent speech, moves all extremities symmetrically Skin: Skin is warm and dry.  Psychiatric: Cooperative   ED Treatments / Results  Labs (all labs ordered are listed, but only abnormal results are displayed) Labs Reviewed  COMPREHENSIVE METABOLIC PANEL - Abnormal; Notable for the following:       Result Value   Glucose, Bld 104 (*)    AST 43 (*)    All other components within normal limits  CBC - Abnormal; Notable for the following:    RDW 16.5 (*)    All other components within normal limits   URINALYSIS, ROUTINE W REFLEX MICROSCOPIC (NOT AT [facility]) - Abnormal; Notable for the following:    Hgb urine dipstick SMALL (*)    All other components within normal limits  URINE MICROSCOPIC-ADD ON - Abnormal; Notable for the following:    Squamous Epithelial / LPF 0-5 (*)    Bacteria, UA FEW (*)    All other components within normal limits  LIPASE, BLOOD    EKG  EKG Interpretation None       Radiology Dg Abdomen Acute W/chest  Result Date: 08/29/2016 CLINICAL DATA:  35 year old male with diarrhea for 2 days and left epigastric pain. Personal history of complicated diverticulitis and partial colectomy. Initial encounter. EXAM: DG ABDOMEN ACUTE W/ 1V CHEST COMPARISON:  Chest radiographs 07/19/2016. Abdominal series 81191, CT Abdomen and Pelvis 05/08/2016 and earlier. FINDINGS: Stable lung volumes. The lungs are clear. No pneumothorax or pneumoperitoneum. Mild cardiomegaly is stable. Other mediastinal contours remain normal. Visualized tracheal air column is within normal limits. Non obstructed bowel gas pattern. Stable abdominal and pelvic visceral contours. Numerous pelvic phleboliths re- demonstrated. No acute osseous abnormality identified. Chronic bilateral acetabular spurring. IMPRESSION: 1.  Normal bowel gas pattern, no free air. 2.  No acute cardiopulmonary abnormality. Electronically Signed   By: Odessa Fleming M.D.   On: 08/29/2016 10:36    Procedures Procedures (including critical care time)  Medications Ordered in ED Medications  sodium chloride 0.9 % bolus 1,000 mL (1,000 mLs Intravenous New Bag/Given 08/29/16 0934)  ondansetron (ZOFRAN) injection 4 mg (4 mg Intravenous Given 08/29/16 1005)  famotidine (PEPCID) IVPB 20 mg premix (20 mg Intravenous New Bag/Given 08/29/16 1009)  morphine 4 MG/ML injection 8 mg (8 mg Intravenous Given 08/29/16 1006)     Initial Impression / Assessment and Plan / ED Course  I have reviewed the triage vital signs and the nursing notes.  Pertinent  labs & imaging results that were available during my care of the patient were reviewed by me and considered in my medical decision making (see chart for details).  Clinical Course    35 year old male With history of partial colectomy secondary to perforated diverticulitis who presents with epigastric abdominal pain with diarrhea. He is well-appearing in no acute distress, with stable vital signs. Abdomen is soft, nonsurgical with primarily epigastric tenderness to palpation. Blood work including CBC, comp metabolic panel, lipase, and urinalysis are reassuring. X-ray of the abdomen without free air or obstructive pattern. Clinically does not appear to be having symptoms of obstruction. Exam not concerning for appendicitis, diverticulitis or other infectious or surgical etiology of abdominal pain. Improved symptoms after IVF and supportive care. At this time I do not feel that he requires any additional imaging. I feel that he is stable for discharge home for continued supportive care management. Strict return and follow-up instructions are reviewed. He expressed understanding of all discharge instructions, and felt comfortable with the plan of  care.  Final Clinical Impressions(s) / ED Diagnoses   Final diagnoses:  Diarrhea, unspecified type  Epigastric pain    New Prescriptions New Prescriptions   FAMOTIDINE (PEPCID) 20 MG TABLET    Take 1 tablet (20 mg total) by mouth daily.   ONDANSETRON (ZOFRAN) 4 MG TABLET    Take 1 tablet (4 mg total) by mouth every 8 (eight) hours as needed for nausea or vomiting.     Lavera Guiseana Duo Liu, MD 08/29/16 1057

## 2016-08-29 NOTE — ED Triage Notes (Signed)
Patient complains of epigastric pain since last night. Nausea and diarrhea with same. Hx of diverticulitis. Alert and oriented, NAD

## 2016-08-29 NOTE — ED Notes (Signed)
All over abd pain since last night states has felt nauseaous has hx of surgery to colon 4 years ago for diverticulitis, also having diaRRHEA

## 2016-08-29 NOTE — Discharge Instructions (Signed)
Return without fail for worsening symptoms, including escalating pain, intractable vomiting, fever, or any other symptoms concerning to you.  Your x-rays and blood work are reassuring today.

## 2016-10-18 ENCOUNTER — Emergency Department (HOSPITAL_COMMUNITY)
Admission: EM | Admit: 2016-10-18 | Discharge: 2016-10-18 | Disposition: A | Discharge status: Home / Self Care | Payer: Self-pay | Attending: Emergency Medicine | Admitting: Emergency Medicine

## 2016-10-18 ENCOUNTER — Emergency Department (HOSPITAL_COMMUNITY): Payer: Self-pay

## 2016-10-18 ENCOUNTER — Encounter (HOSPITAL_COMMUNITY): Payer: Self-pay

## 2016-10-18 DIAGNOSIS — Z7982 Long term (current) use of aspirin: Secondary | ICD-10-CM | POA: Insufficient documentation

## 2016-10-18 DIAGNOSIS — I10 Essential (primary) hypertension: Secondary | ICD-10-CM | POA: Insufficient documentation

## 2016-10-18 DIAGNOSIS — Z79899 Other long term (current) drug therapy: Secondary | ICD-10-CM | POA: Insufficient documentation

## 2016-10-18 DIAGNOSIS — R1032 Left lower quadrant pain: Secondary | ICD-10-CM | POA: Insufficient documentation

## 2016-10-18 DIAGNOSIS — F17200 Nicotine dependence, unspecified, uncomplicated: Secondary | ICD-10-CM | POA: Insufficient documentation

## 2016-10-18 LAB — URINALYSIS, ROUTINE W REFLEX MICROSCOPIC
BILIRUBIN URINE: NEGATIVE
GLUCOSE, UA: NEGATIVE mg/dL
HGB URINE DIPSTICK: NEGATIVE
KETONES UR: NEGATIVE mg/dL
Leukocytes, UA: NEGATIVE
Nitrite: NEGATIVE
PROTEIN: NEGATIVE mg/dL
Specific Gravity, Urine: 1.019 (ref 1.005–1.030)
pH: 6 (ref 5.0–8.0)

## 2016-10-18 LAB — COMPREHENSIVE METABOLIC PANEL
ALT: 48 U/L (ref 17–63)
AST: 39 U/L (ref 15–41)
Albumin: 4.3 g/dL (ref 3.5–5.0)
Alkaline Phosphatase: 61 U/L (ref 38–126)
Anion gap: 7 (ref 5–15)
BUN: 11 mg/dL (ref 6–20)
CHLORIDE: 105 mmol/L (ref 101–111)
CO2: 26 mmol/L (ref 22–32)
CREATININE: 0.89 mg/dL (ref 0.61–1.24)
Calcium: 10 mg/dL (ref 8.9–10.3)
GFR calc Af Amer: >60 mL/min (ref >60)
Glucose, Bld: 147 mg/dL — ABNORMAL HIGH (ref 65–99)
Potassium: 4 mmol/L (ref 3.5–5.1)
Sodium: 138 mmol/L (ref 135–145)
Total Bilirubin: 0.6 mg/dL (ref 0.3–1.2)
Total Protein: 7.9 g/dL (ref 6.5–8.1)

## 2016-10-18 LAB — CBC
HCT: 40.6 % (ref 39.0–52.0)
Hemoglobin: 14.1 g/dL (ref 13.0–17.0)
MCH: 29.4 pg (ref 26.0–34.0)
MCHC: 34.7 g/dL (ref 30.0–36.0)
MCV: 84.6 fL (ref 78.0–100.0)
PLATELETS: 280 10*3/uL (ref 150–400)
RBC: 4.8 MIL/uL (ref 4.22–5.81)
RDW: 15.5 % (ref 11.5–15.5)
WBC: 8 10*3/uL (ref 4.0–10.5)

## 2016-10-18 LAB — LIPASE, BLOOD: LIPASE: 24 U/L (ref 11–51)

## 2016-10-18 MED ORDER — ONDANSETRON 4 MG PO TBDP
4.0000 mg | ORAL_TABLET | Freq: Once | ORAL | Status: AC | PRN
  Administered 2016-10-18: 4 mg via ORAL

## 2016-10-18 MED ORDER — HYDROMORPHONE HCL 2 MG/ML IJ SOLN
1.0000 mg | Freq: Once | INTRAMUSCULAR | Status: AC
  Administered 2016-10-18: 1 mg via INTRAVENOUS
  Filled 2016-10-18: qty 1

## 2016-10-18 MED ORDER — ONDANSETRON 4 MG PO TBDP
ORAL_TABLET | ORAL | Status: AC
  Filled 2016-10-18: qty 1

## 2016-10-18 MED ORDER — BARIUM SULFATE 2.1 % PO SUSP
ORAL | Status: AC
  Filled 2016-10-18: qty 2

## 2016-10-18 MED ORDER — IOPAMIDOL (ISOVUE-300) INJECTION 61%
INTRAVENOUS | Status: AC
  Filled 2016-10-18: qty 30

## 2016-10-18 MED ORDER — HYDROCODONE-ACETAMINOPHEN 5-325 MG PO TABS: 1.0000 | ORAL_TABLET | Freq: Four times a day (QID) | ORAL | 0 refills | Status: DC | PRN

## 2016-10-18 MED ORDER — ONDANSETRON HCL 4 MG/2ML IJ SOLN
4.0000 mg | Freq: Once | INTRAMUSCULAR | Status: AC
  Administered 2016-10-18: 4 mg via INTRAVENOUS
  Filled 2016-10-18: qty 2

## 2016-10-18 NOTE — ED Triage Notes (Signed)
Pt presents for evaluation of generalized abd pain with intermittent N/V/D x 3 days. Pt. Endorses 2 episodes of emesis and diarrhea today. Pt denies fever at home.

## 2016-10-18 NOTE — ED Notes (Signed)
Pt finished contrast

## 2016-10-18 NOTE — ED Provider Notes (Signed)
Care assumed from Dr. Anitra LauthPlunkett at shift change. Patient presented here with abdominal pain that is left sided. His laboratory studies are unremarkable and abdominal CT reveals no acute process. He does have diverticulosis, however with no evidence of diverticulitis. I am uncertain as to the exact etiology of his pain. He does report some vomiting and diarrhea and this could potentially be some sort of infectious enteritis. He will be given a small quantity of pain medicine which he can take for the next 1-2 days. He is to return if he worsens in the meantime.   Geoffery Lyonsouglas Veta Dambrosia, MD 10/18/16 [phone]

## 2016-10-18 NOTE — ED Provider Notes (Signed)
[facility] Provider Note   CSN: [id number] Arrival date & time: 10/18/16  1049     History   Chief Complaint Chief Complaint  Patient presents with  . Abdominal Pain    HPI Zavon Mariam DollarKearns is a 35 y.o. male.  The history is provided by the patient.  Abdominal Pain   This is a new problem. Episode onset: 3 days ago. The problem occurs constantly. The problem has been gradually worsening. Associated with: started spontaneously.  no known trauma. The pain is located in the LLQ and LUQ (left flank). The quality of the pain is sharp, shooting and throbbing. The pain is at a severity of 9/10. The pain is severe. Associated symptoms include anorexia, nausea and vomiting. Pertinent negatives include fever, diarrhea, constipation, dysuria, frequency and headaches. The symptoms are aggravated by activity and eating. Nothing relieves the symptoms. Past medical history comments: prior hemicolectomy from diverticulitis.    Past Medical History:  Diagnosis Date  . Diverticulitis 2010, 2011  . Hypertension     Patient Active Problem List   Diagnosis Date Noted  . Partial small bowel obstruction 05/08/2016  . HTN (hypertension) 05/08/2016    Past Surgical History:  Procedure Laterality Date  . PARTIAL COLECTOMY  2010       Home Medications    Prior to Admission medications   Medication Sig Start Date End Date Taking? Authorizing Provider  amLODipine (NORVASC) 10 MG tablet Take 1 tablet (10 mg total) by mouth daily. 05/10/16   Costin Otelia SergeantM Gherghe, MD  aspirin EC 325 MG tablet Take 325 mg by mouth daily as needed for moderate pain.    Historical Provider, MD  famotidine (PEPCID) 20 MG tablet Take 1 tablet (20 mg total) by mouth daily. 08/29/16   Lavera Guiseana Duo Liu, MD  HYDROcodone-acetaminophen (NORCO/VICODIN) 5-325 MG tablet Take 1-2 tablets by mouth every 6 (six) hours as needed for severe pain. 05/10/16   Costin Otelia SergeantM Gherghe, MD  lisinopril (PRINIVIL,ZESTRIL) 20 MG tablet Take 1 tablet  (20 mg total) by mouth daily. 05/10/16 05/10/17  Costin Otelia SergeantM Gherghe, MD  ondansetron (ZOFRAN) 4 MG tablet Take 1 tablet (4 mg total) by mouth every 8 (eight) hours as needed for nausea or vomiting. 08/29/16   Lavera Guiseana Duo Liu, MD    Family History No family history on file.  Social History Social History  Substance Use Topics  . Smoking status: Current Some Day Smoker  . Smokeless tobacco: Not on file  . Alcohol use Yes     Comment: weekly     Allergies   Shellfish allergy and Contrast media [iodinated diagnostic agents]   Review of Systems Review of Systems  Constitutional: Negative for fever.  Gastrointestinal: Positive for abdominal pain, anorexia, nausea and vomiting. Negative for constipation and diarrhea.  Genitourinary: Negative for dysuria and frequency.  Neurological: Negative for headaches.  All other systems reviewed and are negative.    Physical Exam Updated Vital Signs BP (!) 155/110 (BP Location: Right Arm)   Pulse 102   Temp 98.3 F (36.8 C) (Oral)   Resp 16   SpO2 99%   Physical Exam  Constitutional: He is oriented to person, place, and time. He appears well-developed and well-nourished. No distress.  HENT:  Head: Normocephalic and atraumatic.  Mouth/Throat: Oropharynx is clear and moist.  Eyes: Conjunctivae and EOM are normal. Pupils are equal, round, and reactive to light.  Neck: Normal range of motion. Neck supple.  Cardiovascular: Normal rate, regular rhythm and intact distal pulses.  No murmur heard. Pulmonary/Chest: Effort normal and breath sounds normal. No respiratory distress. He has no wheezes. He has no rales.  Abdominal: Soft. He exhibits no distension. Bowel sounds are decreased. There is tenderness in the left upper quadrant and left lower quadrant. There is CVA tenderness. There is no rebound and no guarding.  Mild left flank pain  Musculoskeletal: Normal range of motion. He exhibits no edema or tenderness.  Neurological: He is alert and  oriented to person, place, and time.  Skin: Skin is warm and dry. No rash noted. No erythema.  Psychiatric: He has a normal mood and affect. His behavior is normal.  Nursing note and vitals reviewed.    ED Treatments / Results  Labs (all labs ordered are listed, but only abnormal results are displayed) Labs Reviewed  CBC  LIPASE, BLOOD  COMPREHENSIVE METABOLIC PANEL  URINALYSIS, ROUTINE W REFLEX MICROSCOPIC    EKG  EKG Interpretation None       Radiology No results found.  Procedures Procedures (including critical care time)  Medications Ordered in ED Medications  ondansetron (ZOFRAN-ODT) 4 MG disintegrating tablet (not administered)  ondansetron (ZOFRAN) injection 4 mg (not administered)  HYDROmorphone (DILAUDID) injection 1 mg (not administered)  ondansetron (ZOFRAN-ODT) disintegrating tablet 4 mg (4 mg Oral Given 10/18/16 1056)     Initial Impression / Assessment and Plan / ED Course  I have reviewed the triage vital signs and the nursing notes.  Pertinent labs & imaging results that were available during my care of the patient were reviewed by me and considered in my medical decision making (see chart for details).  Clinical Course     Patient is a 35 year old male with a history of diverticulitis status post partial colectomy presenting today with a three-day history of worsening abdominal pain. The pain is located on the left side and radiates into the flank. He has had nausea vomiting and diarrhea but denies any fever. He states this pain today is different than his prior diverticulitis pain. He has noticed his urine being dark but denies any hematuria. He has had no blood in his stool or vomitus. He has been taking some aspirin at home which has not helped with the pain but denies any other medication use. On exam patient's abdomen is soft tenderness is reproduced with palpation in the left flank and left mid abdomen but no guarding. Decreased bowel  sounds. Concern for complication from prior surgery such as anastomotic leak, abscess or recurrent diverticulitis/colitis versus renal stone. CBC, CMP, UA pending. Patient given IV fluids, pain and nausea medication. Final Clinical Impressions(s) / ED Diagnoses   Final diagnoses:  None    New Prescriptions New Prescriptions   No medications on file     Gwyneth SproutWhitney Briel Gallicchio, MD 10/20/16 [phone]

## 2016-10-18 NOTE — Discharge Instructions (Signed)
Hydrocodone as prescribed as needed for pain.  Return to the emergency department if you develop worsening pain, high fevers, bloody stools, or other new and concerning symptoms.

## 2016-10-26 ENCOUNTER — Emergency Department (HOSPITAL_COMMUNITY)
Admission: EM | Admit: 2016-10-26 | Discharge: 2016-10-26 | Disposition: A | Discharge status: Home / Self Care | Payer: Self-pay | Attending: Emergency Medicine | Admitting: Emergency Medicine

## 2016-10-26 ENCOUNTER — Encounter (HOSPITAL_COMMUNITY): Payer: Self-pay | Admitting: *Deleted

## 2016-10-26 ENCOUNTER — Emergency Department (HOSPITAL_COMMUNITY): Payer: Self-pay

## 2016-10-26 DIAGNOSIS — R0789 Other chest pain: Secondary | ICD-10-CM | POA: Insufficient documentation

## 2016-10-26 DIAGNOSIS — F17200 Nicotine dependence, unspecified, uncomplicated: Secondary | ICD-10-CM | POA: Insufficient documentation

## 2016-10-26 DIAGNOSIS — K449 Diaphragmatic hernia without obstruction or gangrene: Secondary | ICD-10-CM | POA: Insufficient documentation

## 2016-10-26 DIAGNOSIS — R112 Nausea with vomiting, unspecified: Secondary | ICD-10-CM | POA: Insufficient documentation

## 2016-10-26 DIAGNOSIS — Z79899 Other long term (current) drug therapy: Secondary | ICD-10-CM | POA: Insufficient documentation

## 2016-10-26 DIAGNOSIS — R1013 Epigastric pain: Secondary | ICD-10-CM

## 2016-10-26 DIAGNOSIS — R197 Diarrhea, unspecified: Secondary | ICD-10-CM | POA: Insufficient documentation

## 2016-10-26 DIAGNOSIS — I10 Essential (primary) hypertension: Secondary | ICD-10-CM | POA: Insufficient documentation

## 2016-10-26 LAB — COMPREHENSIVE METABOLIC PANEL
ALBUMIN: 4.3 g/dL (ref 3.5–5.0)
ALT: 88 U/L — ABNORMAL HIGH (ref 17–63)
ANION GAP: 11 (ref 5–15)
AST: 58 U/L — ABNORMAL HIGH (ref 15–41)
Alkaline Phosphatase: 61 U/L (ref 38–126)
BUN: 10 mg/dL (ref 6–20)
CO2: 26 mmol/L (ref 22–32)
Calcium: 9.6 mg/dL (ref 8.9–10.3)
Chloride: 102 mmol/L (ref 101–111)
Creatinine, Ser: 0.92 mg/dL (ref 0.61–1.24)
GFR calc non Af Amer: >60 mL/min (ref >60)
Glucose, Bld: 136 mg/dL — ABNORMAL HIGH (ref 65–99)
POTASSIUM: 3.9 mmol/L (ref 3.5–5.1)
SODIUM: 139 mmol/L (ref 135–145)
Total Bilirubin: 0.8 mg/dL (ref 0.3–1.2)
Total Protein: 8 g/dL (ref 6.5–8.1)

## 2016-10-26 LAB — RAPID URINE DRUG SCREEN, HOSP PERFORMED
Amphetamines: NOT DETECTED
Barbiturates: NOT DETECTED
Benzodiazepines: NOT DETECTED
Cocaine: NOT DETECTED
Opiates: NOT DETECTED
Tetrahydrocannabinol: NOT DETECTED

## 2016-10-26 LAB — URINALYSIS, ROUTINE W REFLEX MICROSCOPIC
BACTERIA UA: NONE SEEN
Bilirubin Urine: NEGATIVE
GLUCOSE, UA: NEGATIVE mg/dL
KETONES UR: NEGATIVE mg/dL
Leukocytes, UA: NEGATIVE
Nitrite: NEGATIVE
PROTEIN: 30 mg/dL — AB
Specific Gravity, Urine: 1.021 (ref 1.005–1.030)
pH: 5 (ref 5.0–8.0)

## 2016-10-26 LAB — DIFFERENTIAL
BASOS ABS: 0 10*3/uL (ref 0.0–0.1)
Basophils Relative: 1 %
Eosinophils Absolute: 0.1 10*3/uL (ref 0.0–0.7)
Eosinophils Relative: 1 %
LYMPHS PCT: 43 %
Lymphs Abs: 2.7 10*3/uL (ref 0.7–4.0)
MONO ABS: 0.7 10*3/uL (ref 0.1–1.0)
Monocytes Relative: 12 %
NEUTROS ABS: 2.8 10*3/uL (ref 1.7–7.7)
NEUTROS PCT: 43 %

## 2016-10-26 LAB — PROTIME-INR
INR: 0.93
PROTHROMBIN TIME: 12.4 s (ref 11.4–15.2)

## 2016-10-26 LAB — TROPONIN I

## 2016-10-26 LAB — CBC
HEMATOCRIT: 41.6 % (ref 39.0–52.0)
HEMOGLOBIN: 14.4 g/dL (ref 13.0–17.0)
MCH: 29.2 pg (ref 26.0–34.0)
MCHC: 34.6 g/dL (ref 30.0–36.0)
MCV: 84.4 fL (ref 78.0–100.0)
Platelets: 263 10*3/uL (ref 150–400)
RBC: 4.93 MIL/uL (ref 4.22–5.81)
RDW: 15.7 % — ABNORMAL HIGH (ref 11.5–15.5)
WBC: 6.4 10*3/uL (ref 4.0–10.5)

## 2016-10-26 LAB — LIPASE, BLOOD: LIPASE: 27 U/L (ref 11–51)

## 2016-10-26 LAB — I-STAT TROPONIN, ED: TROPONIN I, POC: 0.01 ng/mL (ref 0.00–0.08)

## 2016-10-26 MED ORDER — METHYLPREDNISOLONE SODIUM SUCC 125 MG IJ SOLR
125.0000 mg | Freq: Once | INTRAMUSCULAR | Status: AC
  Administered 2016-10-26: 125 mg via INTRAVENOUS
  Filled 2016-10-26: qty 2

## 2016-10-26 MED ORDER — ONDANSETRON HCL 4 MG/2ML IJ SOLN
4.0000 mg | Freq: Once | INTRAMUSCULAR | Status: AC
  Administered 2016-10-26: 4 mg via INTRAVENOUS
  Filled 2016-10-26: qty 2

## 2016-10-26 MED ORDER — IOPAMIDOL (ISOVUE-370) INJECTION 76%
INTRAVENOUS | Status: AC
Start: 2016-10-26 — End: 2016-10-26
  Administered 2016-10-26: 100 mL
  Filled 2016-10-26: qty 100

## 2016-10-26 MED ORDER — MORPHINE SULFATE (PF) 4 MG/ML IV SOLN
4.0000 mg | Freq: Once | INTRAVENOUS | Status: AC
  Administered 2016-10-26: 4 mg via INTRAVENOUS
  Filled 2016-10-26: qty 1

## 2016-10-26 MED ORDER — ONDANSETRON 4 MG PO TBDP: 4.0000 mg | ORAL_TABLET | ORAL | 0 refills | Status: AC | PRN

## 2016-10-26 MED ORDER — PANTOPRAZOLE SODIUM 20 MG PO TBEC
20.0000 mg | DELAYED_RELEASE_TABLET | Freq: Once | ORAL | Status: AC
  Administered 2016-10-26: 20 mg via ORAL
  Filled 2016-10-26 (×2): qty 1

## 2016-10-26 MED ORDER — DIPHENHYDRAMINE HCL 50 MG/ML IJ SOLN
50.0000 mg | Freq: Once | INTRAMUSCULAR | Status: AC
  Administered 2016-10-26: 50 mg via INTRAVENOUS
  Filled 2016-10-26: qty 1

## 2016-10-26 MED ORDER — SODIUM CHLORIDE 0.9 % IV SOLN
Freq: Once | INTRAVENOUS | Status: AC
  Administered 2016-10-26: 12:00:00 via INTRAVENOUS

## 2016-10-26 MED ORDER — PANTOPRAZOLE SODIUM 20 MG PO TBEC: 20.0000 mg | DELAYED_RELEASE_TABLET | Freq: Every day | ORAL | 0 refills | Status: DC

## 2016-10-26 NOTE — ED Provider Notes (Signed)
[facility] Provider Note   CSN: [id number] Arrival date & time: 10/26/16  1050     History   Chief Complaint Chief Complaint  Patient presents with  . Abdominal Pain    HPI Joshua Patel is a 35 y.o. male.  HPI Patient has pain that started almost a week ago in his epigastric and left upper quadrant area. Pain that was increasing for several days. After about 2 days of escalating pain, patient reports he developed intermittent vomiting. He estimates about 2 episodes of vomiting per day. Pain began radiating up into his chest and arm. He reports it is also now radiating into his neck. No fever, chills or cough. He does report several days of loose stool as well. He does have medical history significant for diverticulitis with perforation requiring partial colectomy (2010) and subsequent small bowel obstruction (04/2016)  not necessitating surgical treatment. Past Medical History:  Diagnosis Date  . Diverticulitis 2010, 2011  . Hypertension     Patient Active Problem List   Diagnosis Date Noted  . Partial small bowel obstruction 05/08/2016  . HTN (hypertension) 05/08/2016    Past Surgical History:  Procedure Laterality Date  . PARTIAL COLECTOMY  2010       Home Medications    Prior to Admission medications   Medication Sig Start Date End Date Taking? Authorizing Provider  lisinopril (PRINIVIL,ZESTRIL) 20 MG tablet Take 1 tablet (20 mg total) by mouth daily. 05/10/16 05/10/17 Yes Costin Otelia SergeantM Gherghe, MD  amLODipine (NORVASC) 10 MG tablet Take 1 tablet (10 mg total) by mouth daily. Patient not taking: Reported on 10/26/2016 05/10/16   Leatha Gildingostin M Gherghe, MD  famotidine (PEPCID) 20 MG tablet Take 1 tablet (20 mg total) by mouth daily. Patient not taking: Reported on 10/18/2016 08/29/16   Lavera Guiseana Duo Liu, MD  HYDROcodone-acetaminophen [facility](NORCO) 5-325 MG tablet Take 1-2 tablets by mouth every 6 (six) hours as needed. Patient not taking: Reported on 10/26/2016 10/18/16   Geoffery Lyonsouglas  Delo, MD  ondansetron (ZOFRAN ODT) 4 MG disintegrating tablet Take 1 tablet (4 mg total) by mouth every 4 (four) hours as needed for nausea or vomiting. 10/26/16   Arby BarretteMarcy Ian Cavey, MD  ondansetron (ZOFRAN) 4 MG tablet Take 1 tablet (4 mg total) by mouth every 8 (eight) hours as needed for nausea or vomiting. Patient not taking: Reported on 10/18/2016 08/29/16   Lavera Guiseana Duo Liu, MD  pantoprazole (PROTONIX) 20 MG tablet Take 1 tablet (20 mg total) by mouth daily. 10/26/16   Arby BarretteMarcy Saquoia Sianez, MD    Family History No family history on file.  Social History Social History  Substance Use Topics  . Smoking status: Current Some Day Smoker  . Smokeless tobacco: Current User  . Alcohol use Yes     Comment: weekly     Allergies   Shellfish allergy and Contrast media [iodinated diagnostic agents]   Review of Systems Review of Systems 10 Systems reviewed and are negative for acute change except as noted in the HPI.   Physical Exam Updated Vital Signs BP 107/71   Pulse 100   Temp 98.1 F (36.7 C) (Oral)   Resp 17   Ht 6\' 5"  (1.956 m)   Wt 260 lb (117.9 kg)   SpO2 98%   BMI 30.83 kg/m   Physical Exam  Constitutional: He is oriented to person, place, and time. He appears well-developed and well-nourished.  Patient appears to be in moderate pain. No respiratory distress.  HENT:  Head: Normocephalic and atraumatic.  Mouth/Throat:  Oropharynx is clear and moist.  Very poor dentition with multiple teeth decayed to the gumline. No facial swelling or acute pain.  Eyes: Conjunctivae and EOM are normal. Pupils are equal, round, and reactive to light.  Neck: Neck supple.  Normal palpation of soft tissues of the neck. No appreciable crepitus.  Cardiovascular: Normal rate and regular rhythm.   No murmur heard. Pulmonary/Chest: Effort normal and breath sounds normal. No respiratory distress.  Abdominal: Soft. There is tenderness.  Moderate to severe epigastric and left upper quadrant tenderness  without guarding. Mild lower abdominal tenderness without guarding.  Musculoskeletal: Normal range of motion. He exhibits no tenderness.  Neurological: He is alert and oriented to person, place, and time. He exhibits normal muscle tone. Coordination normal.  Skin: Skin is warm and dry.  Psychiatric: He has a normal mood and affect.  Nursing note and vitals reviewed.    ED Treatments / Results  Labs (all labs ordered are listed, but only abnormal results are displayed) Labs Reviewed  COMPREHENSIVE METABOLIC PANEL - Abnormal; Notable for the following:       Result Value   Glucose, Bld 136 (*)    AST 58 (*)    ALT 88 (*)    All other components within normal limits  CBC - Abnormal; Notable for the following:    RDW 15.7 (*)    All other components within normal limits  URINALYSIS, ROUTINE W REFLEX MICROSCOPIC - Abnormal; Notable for the following:    Hgb urine dipstick SMALL (*)    Protein, ur 30 (*)    Squamous Epithelial / LPF 0-5 (*)    All other components within normal limits  LIPASE, BLOOD  TROPONIN I  PROTIME-INR  RAPID URINE DRUG SCREEN, HOSP PERFORMED  DIFFERENTIAL  I-STAT TROPOININ, ED  I-STAT CG4 LACTIC ACID, ED    EKG  EKG Interpretation  Date/Time:  Friday October 26 2016 11:34:58 EST Ventricular Rate:  92 PR Interval:  152 QRS Duration: 115 QT Interval:  372 QTC Calculation: 461 R Axis:   97 Text Interpretation:  Sinus rhythm LVH with IVCD and secondary repol abnrm inferior lateral T wave inversions unchanged since prior traciing earlier in day Confirmed by CAMPOS  MD, Caryn BeeKEVIN ([id number]) on 10/27/2016 5:27:50 PM       Radiology No results found.  Procedures Procedures (including critical care time)  Medications Ordered in ED Medications  morphine 4 MG/ML injection 4 mg (4 mg Intravenous Given 10/26/16 1229)  diphenhydrAMINE (BENADRYL) injection 50 mg (50 mg Intravenous Given 10/26/16 1228)  methylPREDNISolone sodium succinate (SOLU-MEDROL) 125 mg/2  mL injection 125 mg (125 mg Intravenous Given 10/26/16 1229)  0.9 %  sodium chloride infusion ( Intravenous Stopped 10/26/16 1617)  ondansetron (ZOFRAN) injection 4 mg (4 mg Intravenous Given 10/26/16 1242)  iopamidol (ISOVUE-370) 76 % injection (100 mLs  Contrast Given 10/26/16 1443)  morphine 4 MG/ML injection 4 mg (4 mg Intravenous Given 10/26/16 1555)  pantoprazole (PROTONIX) EC tablet 20 mg (20 mg Oral Given 10/26/16 1639)     Initial Impression / Assessment and Plan / ED Course  I have reviewed the triage vital signs and the nursing notes.  Pertinent labs & imaging results that were available during my care of the patient were reviewed by me and considered in my medical decision making (see chart for details).  Clinical Course    Consult: 11:55 Dr. Royann Shiversroitoru has reviewed the EKG and is not consistent with acute MI or ischemia. 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  Final Clinical Impressions(s) / ED Diagnoses   Final diagnoses:  Epigastric pain  Other chest pain  Hiatal hernia  Nausea vomiting and diarrhea   Diagnostic workup does not show acute anomaly on CT of emergent etiology. Small hiatal hernia is identified. This may account for the patient's epigastric pain with vomiting and radiation. No obstruction present. Cardiac workup is negative. By description, this was very low probability for ischemic type of pattern. Patient did have EKG anomaly however this is reviewed with cardiology and not suggestive of ischemia but LVH. Patient got proved pain in the emergency department. Counseled on plan of initiating a daily PPI and Zofran prescribed for nausea. Follow-up and return instructions reviewed. New Prescriptions Discharge Medication List as of 10/26/2016  4:13 PM    START taking these medications   Details  ondansetron (ZOFRAN ODT) 4 MG disintegrating tablet Take 1 tablet (4 mg total) by mouth every 4 (four) hours as needed for nausea or  vomiting., Starting Fri 10/26/2016, Print    pantoprazole (PROTONIX) 20 MG tablet Take 1 tablet (20 mg total) by mouth daily., Starting Fri 10/26/2016, Print         Arby Barrette, MD 11/02/16 [phone]

## 2016-10-26 NOTE — ED Notes (Signed)
ED Provider at bedside. 

## 2016-10-26 NOTE — ED Triage Notes (Signed)
To ED for eval of luq pain with radiation to left shoulder. Pt states he has been in the ED for same this week and dx with diverticulosis. Pt was given pain meds but unable to get them filled. Pt has been vomiting and had diarrhea since the start of this pain. Hx of same with surgical intervention.

## 2016-10-26 NOTE — ED Notes (Signed)
PT has no requests at this time. PT made aware that CT is scheduled for 1430 due to premedication for shellfish allergy. PT confirms understanding

## 2016-10-26 NOTE — ED Notes (Signed)
Upon arriving to room PT requests to use restroom urgently. PT ambulates to restroom

## 2016-10-26 NOTE — ED Notes (Signed)
Pt is aware of high blood pressure. Pt stated that he takes meds for BP but ran out as of yesterday.

## 2016-10-26 NOTE — ED Notes (Signed)
Patient transported to CT 

## 2016-10-26 NOTE — ED Notes (Signed)
Pt placed in gown, connected to cardiac monitor, continuous pulse oximetry and BP cuff.

## 2016-10-26 NOTE — ED Notes (Signed)
Patient Alert and oriented X4. Stable and ambulatory. Patient verbalized understanding of the discharge instructions.  Patient belongings were taken by the patient.  

## 2016-11-12 ENCOUNTER — Encounter (HOSPITAL_COMMUNITY): Payer: Self-pay | Admitting: Emergency Medicine

## 2016-11-12 ENCOUNTER — Emergency Department (HOSPITAL_COMMUNITY): Payer: 59

## 2016-11-12 ENCOUNTER — Emergency Department (HOSPITAL_COMMUNITY)
Admission: EM | Admit: 2016-11-12 | Discharge: 2016-11-12 | Disposition: A | Discharge status: Home / Self Care | Payer: 59 | Attending: Emergency Medicine | Admitting: Emergency Medicine

## 2016-11-12 DIAGNOSIS — R109 Unspecified abdominal pain: Secondary | ICD-10-CM

## 2016-11-12 DIAGNOSIS — R197 Diarrhea, unspecified: Secondary | ICD-10-CM

## 2016-11-12 DIAGNOSIS — K5790 Diverticulosis of intestine, part unspecified, without perforation or abscess without bleeding: Secondary | ICD-10-CM | POA: Insufficient documentation

## 2016-11-12 DIAGNOSIS — R110 Nausea: Secondary | ICD-10-CM | POA: Insufficient documentation

## 2016-11-12 DIAGNOSIS — R1084 Generalized abdominal pain: Secondary | ICD-10-CM | POA: Diagnosis present

## 2016-11-12 DIAGNOSIS — Z79899 Other long term (current) drug therapy: Secondary | ICD-10-CM | POA: Diagnosis not present

## 2016-11-12 DIAGNOSIS — I10 Essential (primary) hypertension: Secondary | ICD-10-CM | POA: Diagnosis not present

## 2016-11-12 LAB — COMPREHENSIVE METABOLIC PANEL
ALBUMIN: 3.8 g/dL (ref 3.5–5.0)
ALK PHOS: 72 U/L (ref 38–126)
ALT: 47 U/L (ref 17–63)
AST: 34 U/L (ref 15–41)
Anion gap: 9 (ref 5–15)
BILIRUBIN TOTAL: 0.7 mg/dL (ref 0.3–1.2)
BUN: 10 mg/dL (ref 6–20)
CALCIUM: 9.7 mg/dL (ref 8.9–10.3)
CO2: 26 mmol/L (ref 22–32)
Chloride: 104 mmol/L (ref 101–111)
Creatinine, Ser: 0.81 mg/dL (ref 0.61–1.24)
GFR calc Af Amer: >60 mL/min (ref >60)
GFR calc non Af Amer: >60 mL/min (ref >60)
GLUCOSE: 108 mg/dL — AB (ref 65–99)
Potassium: 4.3 mmol/L (ref 3.5–5.1)
SODIUM: 139 mmol/L (ref 135–145)
Total Protein: 7.6 g/dL (ref 6.5–8.1)

## 2016-11-12 LAB — URINALYSIS, ROUTINE W REFLEX MICROSCOPIC
Bilirubin Urine: NEGATIVE
GLUCOSE, UA: NEGATIVE mg/dL
KETONES UR: NEGATIVE mg/dL
Leukocytes, UA: NEGATIVE
NITRITE: NEGATIVE
PROTEIN: NEGATIVE mg/dL
Specific Gravity, Urine: 1.018 (ref 1.005–1.030)
pH: 5 (ref 5.0–8.0)

## 2016-11-12 LAB — DIFFERENTIAL
BASOS PCT: 1 %
Basophils Absolute: 0 10*3/uL (ref 0.0–0.1)
EOS PCT: 1 %
Eosinophils Absolute: 0.1 10*3/uL (ref 0.0–0.7)
LYMPHS PCT: 41 %
Lymphs Abs: 2.2 10*3/uL (ref 0.7–4.0)
MONO ABS: 0.5 10*3/uL (ref 0.1–1.0)
Monocytes Relative: 9 %
NEUTROS PCT: 48 %
Neutro Abs: 2.5 10*3/uL (ref 1.7–7.7)

## 2016-11-12 LAB — CBC
HEMATOCRIT: 39.1 % (ref 39.0–52.0)
Hemoglobin: 13.2 g/dL (ref 13.0–17.0)
MCH: 28.7 pg (ref 26.0–34.0)
MCHC: 33.8 g/dL (ref 30.0–36.0)
MCV: 85 fL (ref 78.0–100.0)
Platelets: 252 10*3/uL (ref 150–400)
RBC: 4.6 MIL/uL (ref 4.22–5.81)
RDW: 16.4 % — AB (ref 11.5–15.5)
WBC: 5.6 10*3/uL (ref 4.0–10.5)

## 2016-11-12 LAB — LIPASE, BLOOD: Lipase: 29 U/L (ref 11–51)

## 2016-11-12 MED ORDER — SODIUM CHLORIDE 0.9 % IV BOLUS (SEPSIS)
1000.0000 mL | Freq: Once | INTRAVENOUS | Status: AC
  Administered 2016-11-12: 1000 mL via INTRAVENOUS

## 2016-11-12 MED ORDER — DIPHENHYDRAMINE HCL 50 MG/ML IJ SOLN
25.0000 mg | Freq: Once | INTRAMUSCULAR | Status: AC
  Administered 2016-11-12: 25 mg via INTRAVENOUS
  Filled 2016-11-12: qty 1

## 2016-11-12 MED ORDER — NAPROXEN 500 MG PO TABS
500.0000 mg | ORAL_TABLET | Freq: Two times a day (BID) | ORAL | 0 refills | Status: AC | PRN
Start: 2016-11-12 — End: ?

## 2016-11-12 MED ORDER — ONDANSETRON 4 MG PO TBDP: 4.0000 mg | ORAL_TABLET | Freq: Three times a day (TID) | ORAL | 0 refills | Status: AC | PRN

## 2016-11-12 MED ORDER — DIATRIZOATE MEGLUMINE & SODIUM 66-10 % PO SOLN
ORAL | Status: AC
  Filled 2016-11-12: qty 30

## 2016-11-12 MED ORDER — HYDROMORPHONE HCL 2 MG/ML IJ SOLN
1.0000 mg | Freq: Once | INTRAMUSCULAR | Status: AC
  Administered 2016-11-12: 1 mg via INTRAVENOUS
  Filled 2016-11-12: qty 1

## 2016-11-12 MED ORDER — AMLODIPINE BESYLATE 10 MG PO TABS: 10.0000 mg | ORAL_TABLET | Freq: Every day | ORAL | 0 refills | Status: DC

## 2016-11-12 MED ORDER — LISINOPRIL 20 MG PO TABS: 20.0000 mg | ORAL_TABLET | Freq: Every day | ORAL | 0 refills | Status: DC

## 2016-11-12 MED ORDER — ONDANSETRON HCL 4 MG/2ML IJ SOLN
4.0000 mg | Freq: Once | INTRAMUSCULAR | Status: AC
  Administered 2016-11-12: 4 mg via INTRAVENOUS
  Filled 2016-11-12: qty 2

## 2016-11-12 MED ORDER — MORPHINE SULFATE (PF) 4 MG/ML IV SOLN
4.0000 mg | Freq: Once | INTRAVENOUS | Status: AC
  Administered 2016-11-12: 4 mg via INTRAVENOUS
  Filled 2016-11-12: qty 1

## 2016-11-12 MED ORDER — HYDROCODONE-ACETAMINOPHEN 5-325 MG PO TABS
1.0000 | ORAL_TABLET | Freq: Once | ORAL | Status: AC
  Administered 2016-11-12: 1 via ORAL
  Filled 2016-11-12: qty 1

## 2016-11-12 MED ORDER — HYDROCODONE-ACETAMINOPHEN 5-325 MG PO TABS: 1.0000 | ORAL_TABLET | Freq: Four times a day (QID) | ORAL | 0 refills | Status: DC | PRN

## 2016-11-12 NOTE — Discharge Instructions (Signed)
Use zofran as prescribed, as needed for nausea. Alternate between naprosyn and norco as directed as needed for pain but don't drive while taking norco. Stay well hydrated with small sips of fluids throughout the day. Follow a BRAT (banana-rice-applesauce-toast) diet as described below for the next 24-48 hours. The 'BRAT' diet is suggested, then progress to diet as tolerated as symptoms abate. Call your regular doctor if bloody stools, persistent diarrhea, vomiting, fever or abdominal pain. Follow up with [facility] in 1 week for recheck of symptoms and to establish care; also follow up with the gastroenterologist for ongoing management of your chronic abdominal pain. Return to ER for changing or worsening of symptoms.   Also, your blood pressure is high! Eat a low salt diet, and take your medications as directed; you were provided with a one-time courtesy refill of your blood pressure medications but you need to follow up with [facility] for ongoing management of your hypertension and future refills.  Abdominal (belly) pain can be caused by many things. Your caregiver performed an examination and possibly ordered blood/urine tests and imaging (CT scan, x-rays, ultrasound). Many cases can be observed and treated at home after initial evaluation in the emergency department. Even though you are being discharged home, abdominal pain can be unpredictable. Therefore, you need a repeated exam if your pain does not resolve, returns, or worsens. Most patients with abdominal pain don't have to be admitted to the hospital or have surgery, but serious problems like appendicitis and gallbladder attacks can start out as nonspecific pain. Many abdominal conditions cannot be diagnosed in one visit, so follow-up evaluations are very important. SEEK IMMEDIATE MEDICAL ATTENTION IF YOU DEVELOP ANY OF THE FOLLOWING SYMPTOMS: The pain does not go away or becomes severe.  A temperature above 101  develops.  Repeated vomiting occurs (multiple episodes).  The pain becomes localized to portions of the abdomen. The right side could possibly be appendicitis. In an adult, the left lower portion of the abdomen could be colitis or diverticulitis.  Blood is being passed in stools or vomit (bright red or black tarry stools).  Return also if you develop chest pain, difficulty breathing, dizziness or fainting, or become confused, poorly responsive, or inconsolable (young children). The constipation stays for more than 4 days.  There is belly (abdominal) or rectal pain.  You do not seem to be getting better.

## 2016-11-12 NOTE — Progress Notes (Signed)
Patient allergic to contrast document 11/12/15. Street PA did not want to wait 4 hours for [facility]. PA stated scan oral contrast only. Dr. Margo AyeHall recommended if gastro oral contrast.

## 2016-11-12 NOTE — ED Notes (Signed)
Patient returned from CT

## 2016-11-12 NOTE — ED Notes (Signed)
Patient transported to CT 

## 2016-11-12 NOTE — ED Provider Notes (Signed)
[facility]-EMERGENCY DEPT Provider Note   CSN: [id number] Arrival date & time: 11/12/16  [id number]     History   Chief Complaint Chief Complaint  Patient presents with  . Abdominal Pain    HPI Joshua Patel is a 36 y.o. male with a PMHx of HTN, diverticulitis with perforated diverticulum requiring partial colectomy in 2010 (unknown surgeon) and subsequent SBO in 04/2016 not requiring surgical management, who presents to the ED with complaints of 3 days of gradually worsening generalized abdominal pain. He describes his pain as 10/10 constant sharp and throbbing generalized abdominal pain radiating to his lower back worse with sitting upright and unrelieved with Pepto-Bismol and ibuprofen. Associated symptoms include chills, nausea, and 4 episodes/day of nonbloody looser-than-normal stools but not watery diarrhea. States this feels like his prior diverticulitis flares. Last ate at 7pm. Admits to being a social alcohol drinker, last drank 1-2beers on Friday (3 days ago). Of note, he has been out of his BP meds for 2wks, states he takes Norvasc 20mg  QD (NOTE: listed in MAR as Norvasc 10mg  QD) and Lisinopril 20mg  QD. Does not have a PCP, has been getting his BP med refills from the ER in [place]. Just got insurance so he's looking for a PCP now.   He denies fevers, CP, SOB, vomiting, constipation, obstipation, melena, hematochezia, rectal pain, penile discharge, testicular pain/swelling, hematuria, dysuria, myalgias, arthralgias, numbness, tingling, weakness, or any other complaints at this time. Denies sick contacts, recent travel, suspicious food intake, NSAID use, or recent abx use.    The history is provided by the patient and medical records. No language interpreter was used.  Abdominal Pain   This is a recurrent problem. The current episode started more than 2 days ago. The problem occurs constantly. The problem has not changed since onset.The pain is associated with an unknown factor. The pain is  located in the generalized abdominal region. The quality of the pain is sharp and throbbing. The pain is at a severity of 10/10. The pain is severe. Associated symptoms include diarrhea and nausea. Pertinent negatives include fever, flatus, hematochezia, melena, vomiting, constipation, dysuria, hematuria, arthralgias and myalgias. The symptoms are aggravated by certain positions. Nothing relieves the symptoms. Past workup includes CT scan and surgery. Past medical history comments: diverticulitis and perf s/p partial colectomy, prior SBO.    Past Medical History:  Diagnosis Date  . Diverticulitis 2010, 2011  . Hypertension     Patient Active Problem List   Diagnosis Date Noted  . Partial small bowel obstruction 05/08/2016  . HTN (hypertension) 05/08/2016    Past Surgical History:  Procedure Laterality Date  . PARTIAL COLECTOMY  2010       Home Medications    Prior to Admission medications   Medication Sig Start Date End Date Taking? Authorizing Provider  amLODipine (NORVASC) 10 MG tablet Take 1 tablet (10 mg total) by mouth daily. Patient not taking: Reported on 10/26/2016 05/10/16   Leatha Gildingostin M Gherghe, MD  famotidine (PEPCID) 20 MG tablet Take 1 tablet (20 mg total) by mouth daily. Patient not taking: Reported on 10/18/2016 08/29/16   Lavera Guiseana Duo Liu, MD  HYDROcodone-acetaminophen [facility](NORCO) 5-325 MG tablet Take 1-2 tablets by mouth every 6 (six) hours as needed. Patient not taking: Reported on 10/26/2016 10/18/16   Geoffery Lyonsouglas Delo, MD  lisinopril (PRINIVIL,ZESTRIL) 20 MG tablet Take 1 tablet (20 mg total) by mouth daily. 05/10/16 05/10/17  Costin Otelia SergeantM Gherghe, MD  ondansetron (ZOFRAN ODT) 4 MG disintegrating tablet Take 1 tablet (4 mg  total) by mouth every 4 (four) hours as needed for nausea or vomiting. 10/26/16   Arby Barrette, MD  ondansetron (ZOFRAN) 4 MG tablet Take 1 tablet (4 mg total) by mouth every 8 (eight) hours as needed for nausea or vomiting. Patient not taking: Reported on  10/18/2016 08/29/16   Lavera Guise, MD  pantoprazole (PROTONIX) 20 MG tablet Take 1 tablet (20 mg total) by mouth daily. 10/26/16   Arby Barrette, MD    Family History History reviewed. No pertinent family history.  Social History Social History  Substance Use Topics  . Smoking status: Current Some Day Smoker  . Smokeless tobacco: Current User  . Alcohol use Yes     Comment: weekly     Allergies   Shellfish allergy and Contrast media [iodinated diagnostic agents]   Review of Systems Review of Systems  Constitutional: Positive for chills. Negative for fever.  Respiratory: Negative for shortness of breath.   Cardiovascular: Negative for chest pain.  Gastrointestinal: Positive for abdominal pain, diarrhea and nausea. Negative for blood in stool, constipation, flatus, hematochezia, melena, rectal pain and vomiting.  Genitourinary: Negative for discharge, dysuria, hematuria, scrotal swelling and testicular pain.  Musculoskeletal: Negative for arthralgias and myalgias.  Skin: Negative for color change.  Allergic/Immunologic: Negative for immunocompromised state.  Neurological: Negative for weakness and numbness.  Psychiatric/Behavioral: Negative for confusion.  10 Systems reviewed and are negative for acute change except as noted in the HPI.    Physical Exam Updated Vital Signs BP (!) 164/115 (BP Location: Right Arm) Comment: on meds for high bp but has ran out of meds  Pulse 88   Temp 98.3 F (36.8 C) (Oral)   Resp 16   Ht 6\' 1"  (1.854 m)   Wt 119.3 kg   SpO2 96%   BMI 34.70 kg/m   Physical Exam  Constitutional: He is oriented to person, place, and time. Vital signs are normal. He appears well-developed and well-nourished.  Non-toxic appearance. No distress.  Afebrile, nontoxic, NAD, mildly hypertensive similar to prior values  HENT:  Head: Normocephalic and atraumatic.  Mouth/Throat: Oropharynx is clear and moist and mucous membranes are normal.  Eyes: Conjunctivae  and EOM are normal. Right eye exhibits no discharge. Left eye exhibits no discharge.  Neck: Normal range of motion. Neck supple.  Cardiovascular: Normal rate, regular rhythm, normal heart sounds and intact distal pulses.  Exam reveals no gallop and no friction rub.   No murmur heard. Pulmonary/Chest: Effort normal and breath sounds normal. No respiratory distress. He has no decreased breath sounds. He has no wheezes. He has no rhonchi. He has no rales.  Abdominal: Soft. Normal appearance. He exhibits no distension. Bowel sounds are decreased. There is generalized tenderness. There is no rigidity, no rebound, no guarding, no CVA tenderness, no tenderness at McBurney's point and negative Murphy's sign.  Soft, nondistended, +BS throughout although slightly hypoactive in the LUQ/LLQ areas, with moderate generalized TTP throughout but more in the LUQ/LLQ, no r/g/r, neg murphy's, neg mcburney's, no CVA TTP   Musculoskeletal: Normal range of motion.  Neurological: He is alert and oriented to person, place, and time. He has normal strength. No sensory deficit.  Skin: Skin is warm, dry and intact. No rash noted.  Psychiatric: He has a normal mood and affect.  Nursing note and vitals reviewed.    ED Treatments / Results  Labs (all labs ordered are listed, but only abnormal results are displayed) Labs Reviewed  COMPREHENSIVE METABOLIC PANEL - Abnormal; Notable for  the following:       Result Value   Glucose, Bld 108 (*)    All other components within normal limits  CBC - Abnormal; Notable for the following:    RDW 16.4 (*)    All other components within normal limits  URINALYSIS, ROUTINE W REFLEX MICROSCOPIC - Abnormal; Notable for the following:    Hgb urine dipstick MODERATE (*)    Bacteria, UA RARE (*)    Squamous Epithelial / LPF 0-5 (*)    All other components within normal limits  LIPASE, BLOOD  DIFFERENTIAL    EKG  EKG Interpretation None       Radiology Ct Abdomen Pelvis Wo  Contrast  Result Date: 11/12/2016 CLINICAL DATA:  Diffuse abdominal pain since Friday, status post partial colectomy for diverticulitis EXAM: CT ABDOMEN AND PELVIS WITHOUT CONTRAST TECHNIQUE: Multidetector CT imaging of the abdomen and pelvis was performed following the standard protocol without IV contrast. COMPARISON:  None. FINDINGS: Lower chest: Lung bases are clear. Hepatobiliary: Mild hepatic steatosis. No focal hepatic lesion is seen. Gallbladder is unremarkable. No intrahepatic or extrahepatic ductal dilatation. Pancreas: Within normal limits. Spleen: Within normal limits. Adrenals/Urinary Tract: Adrenal glands are within normal limits. 1.3 cm left upper pole renal cyst (series 2/image 20). Right kidney is within normal limits. No renal, ureteral, or bladder calculi.  No hydronephrosis. Bladder is within normal limits. Stomach/Bowel: Stomach is within normal limits. No evidence of bowel obstruction. Normal appendix (series 2/image 70). Status post left hemicolectomy with suture line in the lower pelvis (series 2/ image 82). Mild left colonic diverticulosis, without evidence of diverticulitis. Vascular/Lymphatic: No evidence of abdominal aortic aneurysm. No suspicious abdominopelvic lymphadenopathy. Reproductive: Prostate is unremarkable. Other: No abdominopelvic ascites. Musculoskeletal: Visualized osseous structures are within normal limits. IMPRESSION: Status post left hemicolectomy. Mild left colonic diverticulosis, without evidence of diverticulitis. No CT findings to account for the patient's diffuse abdominal pain. Electronically Signed   By: Charline Bills M.D.   On: 11/12/2016 13:06    Procedures Procedures (including critical care time)  Medications Ordered in ED Medications  diatrizoate meglumine-sodium (GASTROGRAFIN) 66-10 % solution (not administered)  HYDROcodone-acetaminophen (NORCO/VICODIN) 5-325 MG per tablet 1 tablet (not administered)  sodium chloride 0.9 % bolus 1,000 mL (0  mLs Intravenous Stopped 11/12/16 1232)  morphine 4 MG/ML injection 4 mg (4 mg Intravenous Given 11/12/16 1010)  ondansetron (ZOFRAN) injection 4 mg (4 mg Intravenous Given 11/12/16 1010)  diphenhydrAMINE (BENADRYL) injection 25 mg (25 mg Intravenous Given 11/12/16 1010)  HYDROmorphone (DILAUDID) injection 1 mg (1 mg Intravenous Given 11/12/16 1149)     Initial Impression / Assessment and Plan / ED Course  I have reviewed the triage vital signs and the nursing notes.  Pertinent labs & imaging results that were available during my care of the patient were reviewed by me and considered in my medical decision making (see chart for details).  Clinical Course     36 y.o. male here with generalized abd pain x3 days which he feels is similar to prior diverticulitis flares; also with nausea, chills, diarrhea. On exam, mildly hypoactive bowel sounds in L abdomen, moderately tender diffusely throughout entire abdomen, nonperitoneal; noted to be hypertensive in the 150s/100s but hasn't had any BP meds in 2wks, and BP similar to prior. Overall, symptoms consistent with diverticulitis vs obstruction, etc. Will get labs and CT abd/pelv; pt has had CT w/contrast both in JAN 2017 and dissection study last month, denies issue with contrast despite this being an allergy documented  in his chart; reviewed Jan 2017 chart and it appears he had itching after morphine PRIOR to CT study, was given benadryl and had no further issues, no anaphylactic reactions ever recorded and pt denies this ever happening. Will proceed with CT abd/pelv WITH contrast, and given benadryl just in case he gets itching with the morphine again. Will give morphine and zofran, and fluids. Will reassess shortly  9:50 AM CT radiologist concerned about documented allergy to contrast; discussed at length that this pt is not allergic to the contrast per chart review and per his reports, but given benadryl just in case before his study; she adamantly  declines performing the study with contrast unless he's premedicated for 4hrs prior to the study; will opt to just go with PO contrast study only and no IV contrast. Of note, CBC WNL, differential pending; remainder of labs pending.  2:05 PM Differential WNL. CMP WNL. Lipase WNL. U/A with some Hgb but rare bacteria and some squamous so likely slightly contaminated; some sperm also present. CT Abd/pelv with mild L colonic diverticulosis without evidence of diverticulitis. Pt feeling slightly better, although still with pain but overall better; will give PO norco before he goes. Pt tolerating PO currently. Will send home with norco/naprosyn, zofran, and refills of BP meds as a courtesy refill but strongly encouraged he establish care for ongoing refills. [place] reviewed prior to dispensing controlled substance medications, and was notable for: norco #12tabs on 06/05/16 but otherwise nothing. Risks/benefits/alternatives and expectations discussed regarding controlled substances. Side effects of medications discussed. Informed consent obtained. Advised BRAT diet, f/up with [facility] in 1wk to establish care and with GI in 1-2wks for ongoing management of his recurrent abd pain. DASH diet advised. F/up with [facility] for HTN management as well as future refills. I explained the diagnosis and have given explicit precautions to return to the ER including for any other new or worsening symptoms. The patient understands and accepts the medical plan as it's been dictated and I have answered their questions. Discharge instructions concerning home care and prescriptions have been given. The patient is STABLE and is discharged to home in good condition.   Final Clinical Impressions(s) / ED Diagnoses   Final diagnoses:  Generalized abdominal pain  Nausea  Diarrhea, unspecified type  Hypertension, unspecified type  Diverticulosis of intestine without bleeding, unspecified intestinal tract location    New  Prescriptions New Prescriptions   AMLODIPINE (NORVASC) 10 MG TABLET    Take 1 tablet (10 mg total) by mouth daily.   HYDROCODONE-ACETAMINOPHEN (NORCO) 5-325 MG TABLET    Take 1 tablet by mouth every 6 (six) hours as needed for severe pain.   LISINOPRIL (PRINIVIL,ZESTRIL) 20 MG TABLET    Take 1 tablet (20 mg total) by mouth daily.   NAPROXEN (NAPROSYN) 500 MG TABLET    Take 1 tablet (500 mg total) by mouth 2 (two) times daily as needed for mild pain, moderate pain or headache (TAKE WITH MEALS.).   ONDANSETRON (ZOFRAN ODT) 4 MG DISINTEGRATING TABLET    Take 1 tablet (4 mg total) by mouth every 8 (eight) hours as needed for nausea or vomiting.     [address] PA-C 11/12/16 1406    Gwyneth Sprout, MD 11/12/16 1920

## 2016-11-12 NOTE — ED Notes (Signed)
Pt ambulatory to restroom

## 2016-11-12 NOTE — ED Triage Notes (Signed)
Pt from home with c/o generalized sharp abdominal pain and cramping x 3 days.  Pt states it feels like his diverticulitis.  NAD, A&O.

## 2016-11-20 ENCOUNTER — Emergency Department (HOSPITAL_BASED_OUTPATIENT_CLINIC_OR_DEPARTMENT_OTHER): Payer: 59

## 2016-11-20 ENCOUNTER — Encounter (HOSPITAL_BASED_OUTPATIENT_CLINIC_OR_DEPARTMENT_OTHER): Payer: Self-pay | Admitting: *Deleted

## 2016-11-20 ENCOUNTER — Emergency Department (HOSPITAL_BASED_OUTPATIENT_CLINIC_OR_DEPARTMENT_OTHER)
Admission: EM | Admit: 2016-11-20 | Discharge: 2016-11-20 | Discharge status: Against Medical Advice | Payer: 59 | Attending: Emergency Medicine | Admitting: Emergency Medicine

## 2016-11-20 DIAGNOSIS — R197 Diarrhea, unspecified: Secondary | ICD-10-CM | POA: Diagnosis not present

## 2016-11-20 DIAGNOSIS — N390 Urinary tract infection, site not specified: Secondary | ICD-10-CM | POA: Diagnosis not present

## 2016-11-20 DIAGNOSIS — I10 Essential (primary) hypertension: Secondary | ICD-10-CM | POA: Diagnosis not present

## 2016-11-20 DIAGNOSIS — R079 Chest pain, unspecified: Secondary | ICD-10-CM | POA: Insufficient documentation

## 2016-11-20 DIAGNOSIS — R1012 Left upper quadrant pain: Secondary | ICD-10-CM | POA: Diagnosis present

## 2016-11-20 DIAGNOSIS — F17200 Nicotine dependence, unspecified, uncomplicated: Secondary | ICD-10-CM | POA: Diagnosis not present

## 2016-11-20 DIAGNOSIS — Z79899 Other long term (current) drug therapy: Secondary | ICD-10-CM | POA: Insufficient documentation

## 2016-11-20 DIAGNOSIS — R1084 Generalized abdominal pain: Secondary | ICD-10-CM

## 2016-11-20 LAB — CBC WITH DIFFERENTIAL/PLATELET
Basophils Absolute: 0 10*3/uL (ref 0.0–0.1)
Basophils Relative: 1 %
Eosinophils Absolute: 0.1 10*3/uL (ref 0.0–0.7)
Eosinophils Relative: 1 %
HCT: 38.4 % — ABNORMAL LOW (ref 39.0–52.0)
HEMOGLOBIN: 13.1 g/dL (ref 13.0–17.0)
LYMPHS PCT: 36 %
Lymphs Abs: 2.4 10*3/uL (ref 0.7–4.0)
MCH: 28.9 pg (ref 26.0–34.0)
MCHC: 34.1 g/dL (ref 30.0–36.0)
MCV: 84.6 fL (ref 78.0–100.0)
Monocytes Absolute: 1.1 10*3/uL — ABNORMAL HIGH (ref 0.1–1.0)
Monocytes Relative: 16 %
NEUTROS PCT: 46 %
Neutro Abs: 3.1 10*3/uL (ref 1.7–7.7)
Platelets: 279 10*3/uL (ref 150–400)
RBC: 4.54 MIL/uL (ref 4.22–5.81)
RDW: 16.2 % — ABNORMAL HIGH (ref 11.5–15.5)
WBC: 6.7 10*3/uL (ref 4.0–10.5)

## 2016-11-20 LAB — TROPONIN I: Troponin I: <0.03 ng/mL (ref <0.03)

## 2016-11-20 LAB — I-STAT CG4 LACTIC ACID, ED: Lactic Acid, Venous: 1.03 mmol/L (ref 0.5–1.9)

## 2016-11-20 LAB — URINALYSIS, MICROSCOPIC (REFLEX)

## 2016-11-20 LAB — URINALYSIS, ROUTINE W REFLEX MICROSCOPIC
Bilirubin Urine: NEGATIVE
Glucose, UA: NEGATIVE mg/dL
Hgb urine dipstick: NEGATIVE
Ketones, ur: NEGATIVE mg/dL
NITRITE: NEGATIVE
PH: 7 (ref 5.0–8.0)
Protein, ur: NEGATIVE mg/dL
SPECIFIC GRAVITY, URINE: 1.023 (ref 1.005–1.030)

## 2016-11-20 MED ORDER — ONDANSETRON HCL 4 MG/2ML IJ SOLN: 4.0000 mg | Freq: Once | INTRAMUSCULAR | Status: DC

## 2016-11-20 MED ORDER — SODIUM CHLORIDE 0.9 % IV BOLUS (SEPSIS)
1000.0000 mL | Freq: Once | INTRAVENOUS | Status: AC
  Administered 2016-11-20: 1000 mL via INTRAVENOUS

## 2016-11-20 MED ORDER — ONDANSETRON HCL 4 MG PO TABS
4.0000 mg | ORAL_TABLET | Freq: Three times a day (TID) | ORAL | 0 refills | Status: DC | PRN
Start: 2016-11-20 — End: 2017-04-29

## 2016-11-20 MED ORDER — SODIUM CHLORIDE 0.9 % IV BOLUS (SEPSIS): 1000.0000 mL | Freq: Once | INTRAVENOUS | Status: DC

## 2016-11-20 MED ORDER — LEVOFLOXACIN 750 MG PO TABS: 750.0000 mg | ORAL_TABLET | Freq: Every day | ORAL | 0 refills | Status: AC

## 2016-11-20 MED ORDER — HYDROMORPHONE HCL 1 MG/ML IJ SOLN
1.0000 mg | Freq: Once | INTRAMUSCULAR | Status: DC
Start: 2016-11-20 — End: 2016-11-20

## 2016-11-20 MED ORDER — HYDROCODONE-ACETAMINOPHEN 5-325 MG PO TABS: 2.0000 | ORAL_TABLET | Freq: Four times a day (QID) | ORAL | 0 refills | Status: DC | PRN

## 2016-11-20 NOTE — ED Triage Notes (Signed)
Pt c/o abd pain over a week , seen here 1/15 for same

## 2016-11-20 NOTE — ED Provider Notes (Signed)
[facility] Provider Note   CSN: [id number] Arrival date & time: 11/20/16  1122     History   Chief Complaint Chief Complaint  Patient presents with  . Abdominal Pain    HPI Joshua Patel is a 36 y.o. male with a past medical history significant for hypertension and diverticulitis status post partial colectomy who presents with severe abdominal pain. Patient describes pain radiating into his left chest. Patient says that this feels worse than his prior episodes of abdominal pain. He says that he was recently worked up several weeks ago for pain but this is "much worse". Patient describes the pain as 9 out of 10 severity, constant, and in the left upper quadrant. He says this feels similar to prior episodes of diverticulitis. He reports taking aspirin and Goody powders which did not help his symptoms. He reports nausea but no vomiting. He reports some diarrhea for the last few days. He reports a dry cough but denies congestion. He reports some chills and diaphoretic episodes. He is a smoker and says that his mother has a history of coronary artery disease causing problems in her 30s.  Patient describes the pain as a twisting and pressure-like discomfort. He has been unable to tolerate by mouth due to pain and nausea.    HPI  Past Medical History:  Diagnosis Date  . Diverticulitis 2010, 2011  . Hypertension     Patient Active Problem List   Diagnosis Date Noted  . Partial small bowel obstruction 05/08/2016  . HTN (hypertension) 05/08/2016    Past Surgical History:  Procedure Laterality Date  . PARTIAL COLECTOMY  2010       Home Medications    Prior to Admission medications   Medication Sig Start Date End Date Taking? Authorizing Provider  amLODipine (NORVASC) 10 MG tablet Take 1 tablet (10 mg total) by mouth daily. 05/10/16   Costin Otelia Sergeant, MD  amLODipine (NORVASC) 10 MG tablet Take 1 tablet (10 mg total) by mouth daily. 11/12/16   Mercedes Strupp Street,  PA-C  famotidine (PEPCID) 20 MG tablet Take 1 tablet (20 mg total) by mouth daily. Patient not taking: Reported on 11/12/2016 08/29/16   Lavera Guise, MD  HYDROcodone-acetaminophen [facility] 5-325 MG tablet Take 1-2 tablets by mouth every 6 (six) hours as needed. Patient not taking: Reported on 11/12/2016 10/18/16   Geoffery Lyons, MD  HYDROcodone-acetaminophen (NORCO) 5-325 MG tablet Take 1 tablet by mouth every 6 (six) hours as needed for severe pain. 11/12/16   Mercedes Strupp Street, PA-C  lisinopril (PRINIVIL,ZESTRIL) 20 MG tablet Take 1 tablet (20 mg total) by mouth daily. 05/10/16 05/10/17  Costin Otelia Sergeant, MD  lisinopril (PRINIVIL,ZESTRIL) 20 MG tablet Take 1 tablet (20 mg total) by mouth daily. 11/12/16   Mercedes Strupp Street, PA-C  naproxen (NAPROSYN) 500 MG tablet Take 1 tablet (500 mg total) by mouth 2 (two) times daily as needed for mild pain, moderate pain or headache (TAKE WITH MEALS.). 11/12/16   Mercedes Strupp Street, PA-C  ondansetron (ZOFRAN ODT) 4 MG disintegrating tablet Take 1 tablet (4 mg total) by mouth every 4 (four) hours as needed for nausea or vomiting. Patient not taking: Reported on 11/12/2016 10/26/16   Arby Barrette, MD  ondansetron (ZOFRAN ODT) 4 MG disintegrating tablet Take 1 tablet (4 mg total) by mouth every 8 (eight) hours as needed for nausea or vomiting. 11/12/16   Mercedes Strupp Street, PA-C  ondansetron (ZOFRAN) 4 MG tablet Take 1 tablet (4 mg total) by  mouth every 8 (eight) hours as needed for nausea or vomiting. Patient not taking: Reported on 11/12/2016 08/29/16   Lavera Guise, MD  pantoprazole (PROTONIX) 20 MG tablet Take 1 tablet (20 mg total) by mouth daily. Patient not taking: Reported on 11/12/2016 10/26/16   Arby Barrette, MD    Family History History reviewed. No pertinent family history.  Social History Social History  Substance Use Topics  . Smoking status: Current Some Day Smoker    Packs/day: 0.50  . Smokeless tobacco: Current User  . Alcohol  use Yes     Comment: weekly     Allergies   Shellfish allergy and Contrast media [iodinated diagnostic agents]   Review of Systems Review of Systems  Constitutional: Positive for chills and diaphoresis. Negative for fatigue and fever.  HENT: Negative for congestion and rhinorrhea.   Respiratory: Negative for cough, chest tightness and shortness of breath.   Cardiovascular: Positive for chest pain.  Gastrointestinal: Positive for abdominal pain, diarrhea and nausea. Negative for vomiting.  Genitourinary: Negative for flank pain.  Musculoskeletal: Negative for back pain, neck pain and neck stiffness.  Skin: Negative for rash and wound.  Neurological: Negative for headaches.  Psychiatric/Behavioral: Negative for agitation.  All other systems reviewed and are negative.    Physical Exam Updated Vital Signs BP (!) 156/127 (BP Location: Left Arm) Comment: Pt states he did not take his BP medication today  Pulse (!) 58   Temp 98.6 F (37 C) (Oral)   Resp 18   Ht 6\' 1"  (1.854 m)   Wt 26 lb (11.8 kg)   SpO2 99%   BMI 3.43 kg/m   Physical Exam  Constitutional: He appears well-developed and well-nourished.  HENT:  Head: Normocephalic and atraumatic.  Mouth/Throat: Oropharynx is clear and moist. No oropharyngeal exudate.  Eyes: Conjunctivae are normal. Pupils are equal, round, and reactive to light.  Neck: Normal range of motion. Neck supple.  Cardiovascular: Normal rate and regular rhythm.   No murmur heard. Pulmonary/Chest: Effort normal and breath sounds normal. No stridor. No respiratory distress.  Abdominal: Soft. There is tenderness.  Musculoskeletal: He exhibits no edema.  Neurological: He is alert. No sensory deficit. He exhibits normal muscle tone.  Skin: Skin is warm and dry. Capillary refill takes less than 2 seconds. No erythema. No pallor.  Psychiatric: He has a normal mood and affect.  Nursing note and vitals reviewed.    ED Treatments / Results  Labs (all  labs ordered are listed, but only abnormal results are displayed) Labs Reviewed  CBC WITH DIFFERENTIAL/PLATELET - Abnormal; Notable for the following:       Result Value   HCT 38.4 (*)    RDW 16.2 (*)    Monocytes Absolute 1.1 (*)    All other components within normal limits  URINALYSIS, ROUTINE W REFLEX MICROSCOPIC - Abnormal; Notable for the following:    APPearance CLOUDY (*)    Leukocytes, UA MODERATE (*)    All other components within normal limits  URINALYSIS, MICROSCOPIC (REFLEX) - Abnormal; Notable for the following:    Bacteria, UA MANY (*)    Squamous Epithelial / LPF 0-5 (*)    All other components within normal limits  TROPONIN I  I-STAT CG4 LACTIC ACID, ED    EKG  EKG Interpretation  Date/Time:  Tuesday November 20 2016 15:52:54 EST Ventricular Rate:  70 PR Interval:    QRS Duration: 118 QT Interval:  472 QTC Calculation: 510 R Axis:   89 Text  Interpretation:  Sinus rhythm Consider left atrial enlargement Probable left ventricular hypertrophy Borderline ST elevation, lateral leads Prolonged QT interval When compared to prior, ST segments are similar and so not appear changed in lead V2.  Long QTc No STEMI Confirmed by Rush Landmark MD, Havyn Ramo [phone]) on 11/21/2016 10:22:30 AM       Radiology No results found.  Procedures Procedures (including critical care time)  Medications Ordered in ED Medications  sodium chloride 0.9 % bolus 1,000 mL (0 mLs Intravenous Stopped 11/20/16 1659)     Initial Impression / Assessment and Plan / ED Course  I have reviewed the triage vital signs and the nursing notes.  Pertinent labs & imaging results that were available during my care of the patient were reviewed by me and considered in my medical decision making (see chart for details).     Joshua Patel is a 36 y.o. male with a past medical history significant for hypertension and diverticulitis status post partial colectomy who presents with severe abdominal  pain.  History and exam are seen above.  On exam, patient has severe tenderness in the left upper quadrant. No CVA tenderness or back tenderness. Lungs are clear. Patient has normal bowel sounds. Patient deferred a GU exam because he "has no pain there". Patient's exam was otherwise unremarkable.  Given patient's history of diverticulitis requiring partial colectomy in the patient's report that his pain is similar but much worse than prior, patient will need laboratory testing and CT scan to evaluate for diverticulitis with perforation or abscess. Patient given pain medicine, fluids, and nausea medicine to alleviate his symptoms during workup.   4:49 PM While awaiting imaging studies and medication Estrace and, patient received some fluids. Patient reports that he wants to leave right now. Next  Patient had his laboratory testing thus far reviewed and determined that patient has possible urinary tract infection. In the setting of his abdominal pain with his moderate leukocytes and bacteria, patient will be treated for UTI. Patient also be given a prescription for some nausea medicine and pain medicine.   Final Clinical Impressions(s) / ED Diagnoses   Final diagnoses:  Lower urinary tract infectious disease  Generalized abdominal pain    New Prescriptions Discharge Medication List as of 11/20/2016  4:48 PM    START taking these medications   Details  !! HYDROcodone-acetaminophen (NORCO/VICODIN) 5-325 MG tablet Take 2 tablets by mouth every 6 (six) hours as needed., Starting Tue 11/20/2016, Print    levofloxacin (LEVAQUIN) 750 MG tablet Take 1 tablet (750 mg total) by mouth daily., Starting Tue 11/20/2016, Until Sun 11/25/2016, Print    !! ondansetron (ZOFRAN) 4 MG tablet Take 1 tablet (4 mg total) by mouth every 8 (eight) hours as needed for nausea or vomiting., Starting Tue 11/20/2016, Print     !! - Potential duplicate medications found. Please discuss with provider.      Clinical  Impression: 1. Lower urinary tract infectious disease   2. LUQ pain   3. Generalized abdominal pain     Disposition: Leaving against medical advice  Condition: Stable  I have discussed the results, Dx and Tx plan with the pt(& family if present). He/she/they expressed understanding and agree(s) with the plan. Discharge instructions discussed at great length. Strict return precautions discussed and pt &/or family have verbalized understanding of the instructions. No further questions at time of discharge.    Discharge Medication List as of 11/20/2016  4:48 PM    START taking these medications   Details  !!  HYDROcodone-acetaminophen (NORCO/VICODIN) 5-325 MG tablet Take 2 tablets by mouth every 6 (six) hours as needed., Starting Tue 11/20/2016, Print    levofloxacin (LEVAQUIN) 750 MG tablet Take 1 tablet (750 mg total) by mouth daily., Starting Tue 11/20/2016, Until Sun 11/25/2016, Print    !! ondansetron (ZOFRAN) 4 MG tablet Take 1 tablet (4 mg total) by mouth every 8 (eight) hours as needed for nausea or vomiting., Starting Tue 11/20/2016, Print     !! - Potential duplicate medications found. Please discuss with provider.      [facility] [address] [id number] [place] [id number] [phone]  If symptoms worsen     Heide Scaleshristopher J Sangita Zani, MD 11/21/16 1023

## 2016-11-20 NOTE — Discharge Instructions (Signed)
Please take the antibiotics to treat your likely urinary tract infection. Was taken nausea medicine and pain medicine as needed to help her symptoms. If any symptoms worsen or do not improve, please return to the nearest emergency department.

## 2016-11-26 ENCOUNTER — Emergency Department (HOSPITAL_BASED_OUTPATIENT_CLINIC_OR_DEPARTMENT_OTHER)
Admission: EM | Admit: 2016-11-26 | Discharge: 2016-11-26 | Disposition: A | Discharge status: Home / Self Care | Payer: 59 | Attending: Emergency Medicine | Admitting: Emergency Medicine

## 2016-11-26 ENCOUNTER — Encounter (HOSPITAL_BASED_OUTPATIENT_CLINIC_OR_DEPARTMENT_OTHER): Payer: Self-pay | Admitting: *Deleted

## 2016-11-26 DIAGNOSIS — I10 Essential (primary) hypertension: Secondary | ICD-10-CM | POA: Insufficient documentation

## 2016-11-26 DIAGNOSIS — F17210 Nicotine dependence, cigarettes, uncomplicated: Secondary | ICD-10-CM | POA: Diagnosis not present

## 2016-11-26 DIAGNOSIS — Z79899 Other long term (current) drug therapy: Secondary | ICD-10-CM | POA: Insufficient documentation

## 2016-11-26 DIAGNOSIS — R110 Nausea: Secondary | ICD-10-CM | POA: Diagnosis not present

## 2016-11-26 DIAGNOSIS — R0602 Shortness of breath: Secondary | ICD-10-CM | POA: Diagnosis not present

## 2016-11-26 DIAGNOSIS — R509 Fever, unspecified: Secondary | ICD-10-CM | POA: Diagnosis not present

## 2016-11-26 DIAGNOSIS — R1013 Epigastric pain: Secondary | ICD-10-CM | POA: Diagnosis present

## 2016-11-26 LAB — URINALYSIS, ROUTINE W REFLEX MICROSCOPIC
Bilirubin Urine: NEGATIVE
GLUCOSE, UA: NEGATIVE mg/dL
Hgb urine dipstick: NEGATIVE
KETONES UR: NEGATIVE mg/dL
LEUKOCYTES UA: NEGATIVE
NITRITE: NEGATIVE
PROTEIN: NEGATIVE mg/dL
Specific Gravity, Urine: 1.026 (ref 1.005–1.030)
pH: 6 (ref 5.0–8.0)

## 2016-11-26 LAB — COMPREHENSIVE METABOLIC PANEL
ALT: 52 U/L (ref 17–63)
AST: 35 U/L (ref 15–41)
Albumin: 3.8 g/dL (ref 3.5–5.0)
Alkaline Phosphatase: 64 U/L (ref 38–126)
Anion gap: 6 (ref 5–15)
BILIRUBIN TOTAL: 0.4 mg/dL (ref 0.3–1.2)
BUN: 13 mg/dL (ref 6–20)
CHLORIDE: 105 mmol/L (ref 101–111)
CO2: 28 mmol/L (ref 22–32)
Calcium: 9.5 mg/dL (ref 8.9–10.3)
Creatinine, Ser: 0.77 mg/dL (ref 0.61–1.24)
Glucose, Bld: 120 mg/dL — ABNORMAL HIGH (ref 65–99)
POTASSIUM: 4 mmol/L (ref 3.5–5.1)
Sodium: 139 mmol/L (ref 135–145)
TOTAL PROTEIN: 7.7 g/dL (ref 6.5–8.1)

## 2016-11-26 LAB — CBC WITH DIFFERENTIAL/PLATELET
Basophils Absolute: 0 10*3/uL (ref 0.0–0.1)
Basophils Relative: 0 %
EOS ABS: 0.1 10*3/uL (ref 0.0–0.7)
Eosinophils Relative: 2 %
HCT: 38.1 % — ABNORMAL LOW (ref 39.0–52.0)
HEMOGLOBIN: 12.9 g/dL — AB (ref 13.0–17.0)
LYMPHS ABS: 2 10*3/uL (ref 0.7–4.0)
LYMPHS PCT: 39 %
MCH: 28.5 pg (ref 26.0–34.0)
MCHC: 33.9 g/dL (ref 30.0–36.0)
MCV: 84.3 fL (ref 78.0–100.0)
Monocytes Absolute: 0.8 10*3/uL (ref 0.1–1.0)
Monocytes Relative: 16 %
NEUTROS PCT: 43 %
Neutro Abs: 2.2 10*3/uL (ref 1.7–7.7)
PLATELETS: 280 10*3/uL (ref 150–400)
RBC: 4.52 MIL/uL (ref 4.22–5.81)
RDW: 16.2 % — ABNORMAL HIGH (ref 11.5–15.5)
WBC: 5.1 10*3/uL (ref 4.0–10.5)

## 2016-11-26 LAB — LIPASE, BLOOD: LIPASE: 26 U/L (ref 11–51)

## 2016-11-26 MED ORDER — HYDROMORPHONE HCL 1 MG/ML IJ SOLN
1.0000 mg | Freq: Once | INTRAMUSCULAR | Status: AC
  Administered 2016-11-26: 1 mg via INTRAVENOUS
  Filled 2016-11-26: qty 1

## 2016-11-26 MED ORDER — OMEPRAZOLE 20 MG PO CPDR: 20.0000 mg | DELAYED_RELEASE_CAPSULE | Freq: Every day | ORAL | 0 refills | Status: DC

## 2016-11-26 MED ORDER — SODIUM CHLORIDE 0.9 % IV BOLUS (SEPSIS)
1000.0000 mL | Freq: Once | INTRAVENOUS | Status: AC
  Administered 2016-11-26: 1000 mL via INTRAVENOUS

## 2016-11-26 MED ORDER — HYDROCODONE-ACETAMINOPHEN 5-325 MG PO TABS: 1.0000 | ORAL_TABLET | Freq: Four times a day (QID) | ORAL | 0 refills | Status: AC | PRN

## 2016-11-26 MED ORDER — ONDANSETRON HCL 4 MG/2ML IJ SOLN
4.0000 mg | Freq: Once | INTRAMUSCULAR | Status: AC
  Administered 2016-11-26: 4 mg via INTRAVENOUS
  Filled 2016-11-26: qty 2

## 2016-11-26 NOTE — ED Triage Notes (Signed)
Pt reports 1 week of abd pain, n/v/d; States "it feels like my diverticulosis". Came to the ED last week but did not stay for evaluation due to wait

## 2016-11-26 NOTE — ED Provider Notes (Signed)
[facility]   CSN: [id number] Arrival date & time: 11/26/16  4098     History   Chief Complaint Chief Complaint  Patient presents with  . Abdominal Pain    HPI Joshua Patel is a 36 y.o. male with history of diverticulitis, partial colectomy, and HTN recently seen last week for similar symptoms, and left AMA presents with worsening epigastric abdominal pain since 7pm last night. He reports constant 5/10 pain that was manageable since he left AMA and now is a 9/10. He describes it as a cramping, constant pain that starts at epigastric area and radiates laterally. He reports associated fever (tmax 101) chills, nausea, and decreased appetite. He has not had a bowel movement since pain started, but he had one several days ago with loose stool. He states that his pain feels similar to his previous diverticulitis. He states he tried [place] powder and aspirin without relief. He states he did not take his blood pressure medications today. He denies vomiting and any urinary symptoms.   The history is provided by the patient. No language interpreter was used.    Past Medical History:  Diagnosis Date  . Diverticulitis 2010, 2011  . Hypertension     Patient Active Problem List   Diagnosis Date Noted  . Partial small bowel obstruction 05/08/2016  . HTN (hypertension) 05/08/2016    Past Surgical History:  Procedure Laterality Date  . PARTIAL COLECTOMY  2010       Home Medications    Prior to Admission medications   Medication Sig Start Date End Date Taking? Authorizing Provider  amLODipine (NORVASC) 10 MG tablet Take 1 tablet (10 mg total) by mouth daily. 11/12/16  Yes Mercedes Strupp Street, PA-C  lisinopril (PRINIVIL,ZESTRIL) 20 MG tablet Take 1 tablet (20 mg total) by mouth daily. 11/12/16  Yes Mercedes Strupp Street, PA-C  amLODipine (NORVASC) 10 MG tablet Take 1 tablet (10 mg total) by mouth daily. 05/10/16   Costin Otelia Sergeant, MD  famotidine (PEPCID) 20  MG tablet Take 1 tablet (20 mg total) by mouth daily. Patient not taking: Reported on 11/12/2016 08/29/16   Lavera Guise, MD  HYDROcodone-acetaminophen (NORCO/VICODIN) 5-325 MG tablet Take 1-2 tablets by mouth every 6 (six) hours as needed. 11/26/16   Francisco Manuel [place], [place]  lisinopril (PRINIVIL,ZESTRIL) 20 MG tablet Take 1 tablet (20 mg total) by mouth daily. 05/10/16 05/10/17  Costin Otelia Sergeant, MD  naproxen (NAPROSYN) 500 MG tablet Take 1 tablet (500 mg total) by mouth 2 (two) times daily as needed for mild pain, moderate pain or headache (TAKE WITH MEALS.). 11/12/16   Mercedes Strupp Street, PA-C  omeprazole (PRILOSEC) 20 MG capsule Take 1 capsule (20 mg total) by mouth daily. 11/26/16   Francisco Manuel [place], PA  ondansetron (ZOFRAN ODT) 4 MG disintegrating tablet Take 1 tablet (4 mg total) by mouth every 4 (four) hours as needed for nausea or vomiting. Patient not taking: Reported on 11/12/2016 10/26/16   Arby Barrette, MD  ondansetron (ZOFRAN ODT) 4 MG disintegrating tablet Take 1 tablet (4 mg total) by mouth every 8 (eight) hours as needed for nausea or vomiting. 11/12/16   Mercedes Strupp Street, PA-C  ondansetron (ZOFRAN) 4 MG tablet Take 1 tablet (4 mg total) by mouth every 8 (eight) hours as needed for nausea or vomiting. Patient not taking: Reported on 11/12/2016 08/29/16   Lavera Guise, MD  ondansetron (ZOFRAN) 4 MG tablet Take 1 tablet (4 mg total) by mouth every 8 (eight)  hours as needed for nausea or vomiting. 11/20/16   Canary Brimhristopher J Tegeler, MD  pantoprazole (PROTONIX) 20 MG tablet Take 1 tablet (20 mg total) by mouth daily. Patient not taking: Reported on 11/12/2016 10/26/16   Arby BarretteMarcy Pfeiffer, MD    Family History No family history on file.  Social History Social History  Substance Use Topics  . Smoking status: Current Some Day Smoker    Packs/day: 0.50    Types: Cigarettes  . Smokeless tobacco: Current User  . Alcohol use Yes     Comment: weekly     Allergies   Shellfish  allergy and Contrast media [iodinated diagnostic agents]   Review of Systems Review of Systems  Constitutional: Positive for chills and fever.  HENT: Negative for trouble swallowing.   Eyes: Negative for visual disturbance.  Respiratory: Positive for shortness of breath (Secondary to pain). Negative for cough.   Cardiovascular: Negative for chest pain.  Gastrointestinal: Positive for abdominal pain and nausea. Negative for vomiting.  Genitourinary: Negative for difficulty urinating and dysuria.  Musculoskeletal: Negative for neck pain and neck stiffness.  Skin: Negative for rash and wound.  Neurological: Negative for numbness.     Physical Exam Updated Vital Signs BP (!) 160/113 (BP Location: Left Arm) Comment: has not had b/p meds today  Pulse 81   Temp 97.8 F (36.6 C) (Oral)   Resp 17   Ht 6' (1.829 m)   Wt 117.9 kg   SpO2 99%   BMI 35.26 kg/m   Physical Exam  Constitutional: He is oriented to person, place, and time. He appears well-developed and well-nourished.  HENT:  Head: Normocephalic and atraumatic.  Eyes: EOM are normal. Pupils are equal, round, and reactive to light.  Neck: Normal range of motion. Neck supple.  Cardiovascular: Normal rate and intact distal pulses.   Pulmonary/Chest: Effort normal and breath sounds normal. No respiratory distress.  CTA. No wheezing or rales.   Abdominal: Bowel sounds are normal. There is tenderness. There is guarding. There is no rebound.  Generalized TTP. Maximally tender at epigastric area.  Negative murphy's sign. No focal tenderness at McBurney's point. No evidence of incarcerated or strangulated hernia.   Neurological: He is alert and oriented to person, place, and time.  Skin: Skin is warm.  Psychiatric: He has a normal mood and affect.  Nursing note and vitals reviewed.    ED Treatments / Results  Labs (all labs ordered are listed, but only abnormal results are displayed) Labs Reviewed  COMPREHENSIVE METABOLIC  PANEL - Abnormal; Notable for the following:       Result Value   Glucose, Bld 120 (*)    All other components within normal limits  CBC WITH DIFFERENTIAL/PLATELET - Abnormal; Notable for the following:    Hemoglobin 12.9 (*)    HCT 38.1 (*)    RDW 16.2 (*)    All other components within normal limits  LIPASE, BLOOD  URINALYSIS, ROUTINE W REFLEX MICROSCOPIC    EKG  EKG Interpretation None       Radiology No results found.  Procedures Procedures (including critical care time)  Medications Ordered in ED Medications  HYDROmorphone (DILAUDID) injection 1 mg (1 mg Intravenous Given 11/26/16 1145)  sodium chloride 0.9 % bolus 1,000 mL (0 mLs Intravenous Stopped 11/26/16 1319)  ondansetron (ZOFRAN) injection 4 mg (4 mg Intravenous Given 11/26/16 1144)     Initial Impression / Assessment and Plan / ED Course  I have reviewed the triage vital signs and the nursing  notes.  Pertinent labs & imaging results that were available during my care of the patient were reviewed by me and considered in my medical decision making (see chart for details).   Pt is a 36 y.o male  With abdominal pain worsened since last night. On exam afebrile, NAD. VSS besides elevated blood pressure which is a chronic thing for him since he is non compliant with his BP medications and similar to previous readings. heart and lung sounds clear. Abdomen is tender diffusely and maximally tender at epigastric with guarding. Negative murphy's sign. No focal tenderness at McBurney's point. No evidence of incarcerated or strangulated hernia.  Lab work is otherwise normal. No abnormalities, or infection noted on urinalysis.   CT will not be done here due to frequency of CT in the past. I do not think it is absolutely necessary due to current presentation, feeling better here in the ED after medications, and lab work otherwise normal.  He States he never got pain medication filled and lost the prescription. However, the [place] controlled substance reporting system and shows that he did get it filled on the 23rd. Nevertheless, he does not currently have a history of opioid abuse with the system and I'm willing to give him a short supply for the time being.  I have reviewed the [place] Controlled Substance Reporting System.  Pt instructed to take omeprazole everyday and take pain medication as needed until seeing his regular providers for further assessment and evaluation. He is to follow-up with a primary care provider in 2-5 days regarding today's visit and BP monitoring and treatment He is to also follow up with Gen. Surgery tomorrow regarding today's visit. He states his insurance doesn't kick in until next month. I gave him resource guide information on financial assistance so that he can be seen sooner. Pt is aggreable to plan and assessment. Return precautions given.   Patient case discussed with Dr. Donnald Garre who agreed with assessment and plan.  Final Clinical Impressions(s) / ED Diagnoses   Final diagnoses:  Epigastric pain    New Prescriptions Discharge Medication List as of 11/26/2016  1:09 PM    START taking these medications   Details  omeprazole (PRILOSEC) 20 MG capsule Take 1 capsule (20 mg total) by mouth daily., Starting Mon 11/26/2016, Print         [address] [place], [place] 11/27/16 1610    Arby Barrette, MD 11/27/16 [phone]

## 2016-11-26 NOTE — Discharge Instructions (Signed)
Please schedule appointment with a primary care provider and Gen. surgery in 2-5 days for follow-up regarding today's visit. See attached antral assistance packet so that you can be seen sooner. Take omeprazole as soon as you wake up 30 minutes before any meals to help line stomach. Take Vicodin every 6 hours as needed for pain.   Get help right away if: Your pain does not go away as soon as your health care provider told you to expect. You cannot stop throwing up. Your pain is only in areas of the abdomen, such as the right side or the left lower portion of the abdomen. You have bloody or black stools, or stools that look like tar. You have severe pain, cramping, or bloating in your abdomen. You have signs of dehydration, such as: Dark urine, very little urine, or no urine. Cracked lips. Dry mouth. Sunken eyes. Sleepiness. Weakness.

## 2017-04-29 ENCOUNTER — Encounter (HOSPITAL_BASED_OUTPATIENT_CLINIC_OR_DEPARTMENT_OTHER): Payer: Self-pay | Admitting: *Deleted

## 2017-04-29 ENCOUNTER — Emergency Department (HOSPITAL_BASED_OUTPATIENT_CLINIC_OR_DEPARTMENT_OTHER)
Admission: EM | Admit: 2017-04-29 | Discharge: 2017-04-29 | Disposition: A | Discharge status: Home / Self Care | Payer: PRIVATE HEALTH INSURANCE | Attending: Emergency Medicine | Admitting: Emergency Medicine

## 2017-04-29 DIAGNOSIS — R110 Nausea: Secondary | ICD-10-CM | POA: Insufficient documentation

## 2017-04-29 DIAGNOSIS — I10 Essential (primary) hypertension: Secondary | ICD-10-CM | POA: Insufficient documentation

## 2017-04-29 DIAGNOSIS — F17210 Nicotine dependence, cigarettes, uncomplicated: Secondary | ICD-10-CM | POA: Insufficient documentation

## 2017-04-29 DIAGNOSIS — R1013 Epigastric pain: Secondary | ICD-10-CM | POA: Diagnosis not present

## 2017-04-29 DIAGNOSIS — Z79899 Other long term (current) drug therapy: Secondary | ICD-10-CM | POA: Diagnosis not present

## 2017-04-29 LAB — CBC WITH DIFFERENTIAL/PLATELET
BASOS PCT: 1 %
Basophils Absolute: 0 10*3/uL (ref 0.0–0.1)
EOS ABS: 0.1 10*3/uL (ref 0.0–0.7)
Eosinophils Relative: 2 %
HEMATOCRIT: 36.5 % — AB (ref 39.0–52.0)
Hemoglobin: 12.5 g/dL — ABNORMAL LOW (ref 13.0–17.0)
Lymphocytes Relative: 36 %
Lymphs Abs: 2.4 10*3/uL (ref 0.7–4.0)
MCH: 28.6 pg (ref 26.0–34.0)
MCHC: 34.2 g/dL (ref 30.0–36.0)
MCV: 83.5 fL (ref 78.0–100.0)
MONOS PCT: 16 %
Monocytes Absolute: 1.1 10*3/uL — ABNORMAL HIGH (ref 0.1–1.0)
NEUTROS ABS: 3.1 10*3/uL (ref 1.7–7.7)
Neutrophils Relative %: 45 %
PLATELETS: 289 10*3/uL (ref 150–400)
RBC: 4.37 MIL/uL (ref 4.22–5.81)
RDW: 17 % — ABNORMAL HIGH (ref 11.5–15.5)
WBC: 6.7 10*3/uL (ref 4.0–10.5)

## 2017-04-29 LAB — COMPREHENSIVE METABOLIC PANEL
ALK PHOS: 63 U/L (ref 38–126)
ALT: 34 U/L (ref 17–63)
AST: 27 U/L (ref 15–41)
Albumin: 3.9 g/dL (ref 3.5–5.0)
Anion gap: 7 (ref 5–15)
BUN: 15 mg/dL (ref 6–20)
CALCIUM: 9.2 mg/dL (ref 8.9–10.3)
CHLORIDE: 103 mmol/L (ref 101–111)
CO2: 28 mmol/L (ref 22–32)
CREATININE: 1.15 mg/dL (ref 0.61–1.24)
GFR calc Af Amer: >60 mL/min (ref >60)
Glucose, Bld: 117 mg/dL — ABNORMAL HIGH (ref 65–99)
Potassium: 3.8 mmol/L (ref 3.5–5.1)
SODIUM: 138 mmol/L (ref 135–145)
Total Bilirubin: 0.4 mg/dL (ref 0.3–1.2)
Total Protein: 7.7 g/dL (ref 6.5–8.1)

## 2017-04-29 LAB — LIPASE, BLOOD: Lipase: 27 U/L (ref 11–51)

## 2017-04-29 LAB — TROPONIN I: Troponin I: <0.03 ng/mL (ref <0.03)

## 2017-04-29 MED ORDER — FAMOTIDINE 20 MG PO TABS
40.0000 mg | ORAL_TABLET | Freq: Once | ORAL | Status: AC
  Administered 2017-04-29: 40 mg via ORAL
  Filled 2017-04-29: qty 2

## 2017-04-29 MED ORDER — SUCRALFATE 1 G PO TABS: 1.0000 g | ORAL_TABLET | Freq: Three times a day (TID) | ORAL | 0 refills | Status: AC

## 2017-04-29 MED ORDER — OMEPRAZOLE 20 MG PO CPDR: 20.0000 mg | DELAYED_RELEASE_CAPSULE | Freq: Every day | ORAL | 0 refills | Status: AC

## 2017-04-29 MED ORDER — GI COCKTAIL ~~LOC~~
30.0000 mL | Freq: Once | ORAL | Status: AC
  Administered 2017-04-29: 30 mL via ORAL
  Filled 2017-04-29: qty 30

## 2017-04-29 NOTE — ED Provider Notes (Signed)
[facility] Provider Note   CSN: [id number] Arrival date & time: 04/29/17  1735     History   Chief Complaint Chief Complaint  Patient presents with  . Abdominal Pain  . Chest Pain    HPI Joshua Patel is a 36 y.o. male.  36yo M w/ PMH including HTN, diverticulitis, partial SBO who p/w abdominal pain. He began having abdominal pain last night and has continued to have constant, severe epigastric pain that radiates around left side to back. Pain is better laying on L side and holding L side. No change with eating. He has tried ibuprofen, goody powder with no relief. He states it feels like previous episode of diverticulitis. He reports left chest tightness since this morning that is worse with certain positions, improved with laying on left side. No cough or associated SOB.  He reports nausea with no vomiting, diarrhea that is non-bloody. He reports fever of 101 today. No urinary symptoms. No sick contacts. He reports alcohol use this past weekend. No cocaine use.    The history is provided by the patient.  Abdominal Pain    Chest Pain   Associated symptoms include abdominal pain.    Past Medical History:  Diagnosis Date  . Diverticulitis 2010, 2011  . Hypertension     Patient Active Problem List   Diagnosis Date Noted  . Partial small bowel obstruction (HCC) 05/08/2016  . HTN (hypertension) 05/08/2016    Past Surgical History:  Procedure Laterality Date  . PARTIAL COLECTOMY  2010       Home Medications    Prior to Admission medications   Medication Sig Start Date End Date Taking? Authorizing Provider  amLODipine (NORVASC) 10 MG tablet Take 1 tablet (10 mg total) by mouth daily. 05/10/16   Leatha Gilding, MD  famotidine (PEPCID) 20 MG tablet Take 1 tablet (20 mg total) by mouth daily. Patient not taking: Reported on 11/12/2016 08/29/16   Lavera Guise, MD  HYDROcodone-acetaminophen (NORCO/VICODIN) 5-325 MG tablet Take 1-2 tablets by mouth every 6  (six) hours as needed. 11/26/16   Alvina Chou, PA  lisinopril (PRINIVIL,ZESTRIL) 20 MG tablet Take 1 tablet (20 mg total) by mouth daily. 05/10/16 05/10/17  Leatha Gilding, MD  naproxen (NAPROSYN) 500 MG tablet Take 1 tablet (500 mg total) by mouth 2 (two) times daily as needed for mild pain, moderate pain or headache (TAKE WITH MEALS.). 11/12/16   Street, Mercedes, PA-C  omeprazole (PRILOSEC) 20 MG capsule Take 1 capsule (20 mg total) by mouth daily. 04/29/17   Little, Ambrose Finland, MD  ondansetron (ZOFRAN ODT) 4 MG disintegrating tablet Take 1 tablet (4 mg total) by mouth every 4 (four) hours as needed for nausea or vomiting. Patient not taking: Reported on 11/12/2016 10/26/16   Arby Barrette, MD  ondansetron (ZOFRAN ODT) 4 MG disintegrating tablet Take 1 tablet (4 mg total) by mouth every 8 (eight) hours as needed for nausea or vomiting. 11/12/16   Street, Mercedes, PA-C  sucralfate (CARAFATE) 1 g tablet Take 1 tablet (1 g total) by mouth 4 (four) times daily -  with meals and at bedtime. 04/29/17 05/06/17  Little, Ambrose Finland, MD    Family History No family history on file.  Social History Social History  Substance Use Topics  . Smoking status: Current Some Day Smoker    Packs/day: 0.50    Types: Cigarettes  . Smokeless tobacco: Current User  . Alcohol use Yes     Comment: weekly  Allergies   Shellfish allergy and Contrast media [iodinated diagnostic agents]   Review of Systems Review of Systems  Cardiovascular: Positive for chest pain.  Gastrointestinal: Positive for abdominal pain.  All other systems reviewed and are negative except that which was mentioned in HPI    Physical Exam Updated Vital Signs BP (!) 158/120   Pulse 88   Temp 98.3 F (36.8 C) (Oral)   Resp 16   Ht 6\' 5"  (1.956 m)   Wt 117.9 kg (260 lb)   SpO2 99%   BMI 30.83 kg/m   Physical Exam  Constitutional: He is oriented to person, place, and time. He appears well-developed and  well-nourished. No distress.  HENT:  Head: Normocephalic and atraumatic.  Moist mucous membranes  Eyes: Conjunctivae are normal. Pupils are equal, round, and reactive to light.  Neck: Neck supple.  Cardiovascular: Normal rate, regular rhythm and normal heart sounds.   No murmur heard. Pulmonary/Chest: Effort normal and breath sounds normal.  Abdominal: Soft. Bowel sounds are normal. He exhibits no distension. There is tenderness (Midepigastric and left upper quadrant). There is no rebound and no guarding.  Musculoskeletal: He exhibits no edema.  Neurological: He is alert and oriented to person, place, and time.  Fluent speech  Skin: Skin is warm and dry.  Psychiatric: He has a normal mood and affect. Judgment normal.  Nursing note and vitals reviewed.    ED Treatments / Results  Labs (all labs ordered are listed, but only abnormal results are displayed) Labs Reviewed  CBC WITH DIFFERENTIAL/PLATELET - Abnormal; Notable for the following:       Result Value   Hemoglobin 12.5 (*)    HCT 36.5 (*)    RDW 17.0 (*)    Monocytes Absolute 1.1 (*)    All other components within normal limits  COMPREHENSIVE METABOLIC PANEL - Abnormal; Notable for the following:    Glucose, Bld 117 (*)    All other components within normal limits  TROPONIN I  LIPASE, BLOOD    EKG  EKG Interpretation  Date/Time:  Monday April 29 2017 17:58:04 EDT Ventricular Rate:  91 PR Interval:  162 QRS Duration: 110 QT Interval:  392 QTC Calculation: 482 R Axis:   63 Text Interpretation:  Normal sinus rhythm Minimal voltage criteria for LVH, may be normal variant Prolonged QT Abnormal ECG similar to previous Confirmed by Frederick PeersLittle, Rachel [phone]([id number]) on 04/29/2017 8:45:04 PM       Radiology No results found.  Procedures Procedures (including critical care time)  Medications Ordered in ED Medications  gi cocktail (Maalox,Lidocaine,Donnatal) (30 mLs Oral Given 04/29/17 2132)  famotidine (PEPCID) tablet 40 mg (40  mg Oral Given 04/29/17 2131)     Initial Impression / Assessment and Plan / ED Course  I have reviewed the triage vital signs and the nursing notes.  Pertinent labs that were available during my care of the patient were reviewed by me and considered in my medical decision making (see chart for details).    Pt w/ epigastric pain, nausea, and associated chest tightness. On exam, he was non-toxic, VS notable for HTN. He had epigastric and LUQ tenderness with no lower abd or RUQ tenderness. Although the patient states that he has had similar symptoms with diverticulitis previously, his exam is not consistent with typical diverticulitis. Labs today are reassuring with normal WBC count, normal LFTs and lipase. No evidence of dehydration. I suspect gastritis or PUD especially given that he reports heavier alcohol use this weekend. He has  had normal BMs and I doubt bowel obstruction especially given no vomiting and soft abdomen.   I had a long discussion with him regarding options, including supportive treatment versus imaging. I explained that he has had multiple CT scans in the past and I'm concerned about his cumulative radiation exposure and risk of future cancer given multiple abdominal CTs. He was agreeable to trying medications.After receiving GI cocktail and Pepcid, the patient stated that he felt better and wanted to go home. He has had no vomiting. He feels comfortable being discharged on PPI and Carafate without imaging at this time and he understands return precautions. Discussed GERD diet and instructed to follow up with PCP. Patient discharged in satisfactory condition.  Final Clinical Impressions(s) / ED Diagnoses   Final diagnoses:  Epigastric pain  Nausea    New Prescriptions Discharge Medication List as of 04/29/2017 10:45 PM    START taking these medications   Details  sucralfate (CARAFATE) 1 g tablet Take 1 tablet (1 g total) by mouth 4 (four) times daily -  with meals and at  bedtime., Starting Mon 04/29/2017, Until Mon 05/06/2017, Print         Little, Ambrose Finland, MD 04/29/17 [phone]

## 2017-04-29 NOTE — ED Triage Notes (Signed)
Abdominal bloating and pain last night. Tightness in his left chest this am. He has had the same pain in the past when he had diverticulitis.

## 2017-04-29 NOTE — Discharge Instructions (Signed)
Avoid greasy and spicy foods, citrus foods, and foods containing tomatoes. Avoid alcohol. Drink liquids and have a bland diet until your symptoms improve and then gradually add back regular foods. Return to ER if you have fever, bloody stools, severe worsening of abdominal pain, or intractable vomiting.

## 2017-06-12 IMAGING — CT CT ABD-PELV W/O CM
2 of 4 series · 15 of 46 positions shown, 17 images · non-contrast
Comparison: CT abdomen and pelvis 01/21/2016.

CLINICAL DATA: Left side abdominal pain beginning last night now
radiating into the right upper quadrant. Diarrhea and constipation.

EXAM:
CT ABDOMEN AND PELVIS WITHOUT CONTRAST
TECHNIQUE: Multidetector CT imaging of the abdomen and pelvis was performed
following the standard protocol without IV contrast.

[Series 2: axial st · axial · 0.98mm/px · z∈[+829,+1379]mm · 12 of 132 slices shown, 14 images]
[im 11/132  soft-tissue]
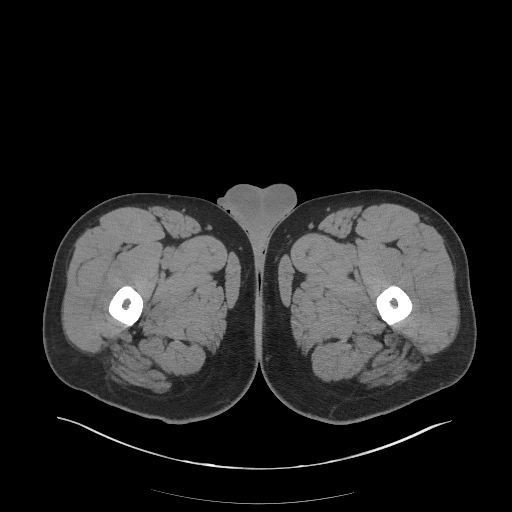
[im 11/132  bone]
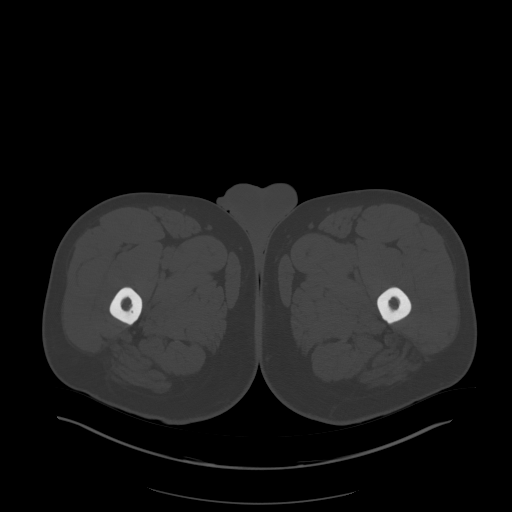
[im 21/132  soft-tissue]
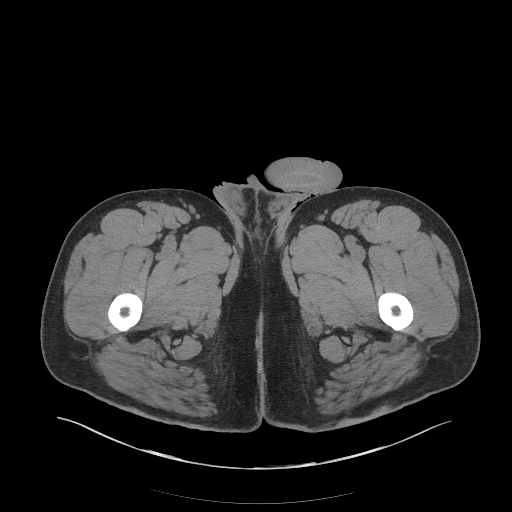
[im 31/132  soft-tissue]
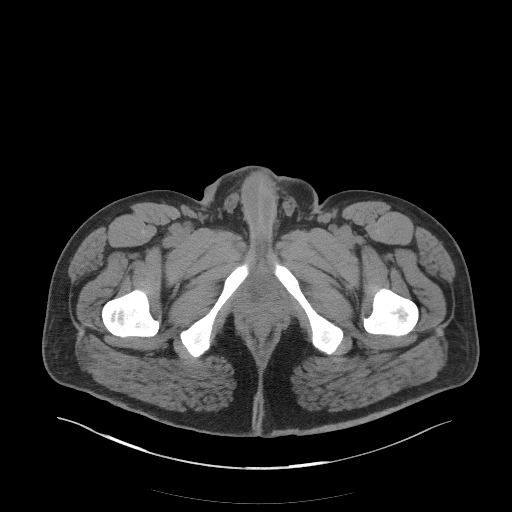
[im 41/132  soft-tissue]
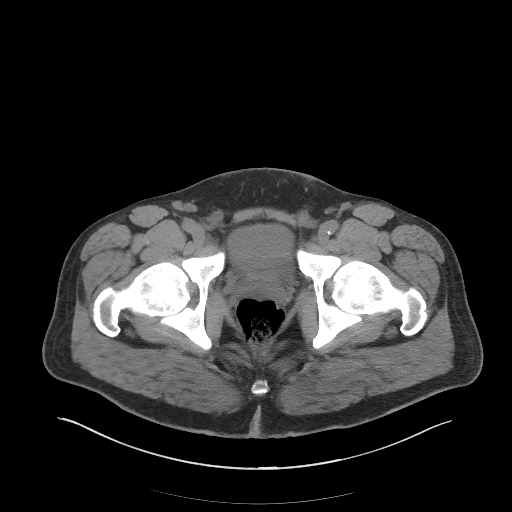
[im 51/132  soft-tissue]
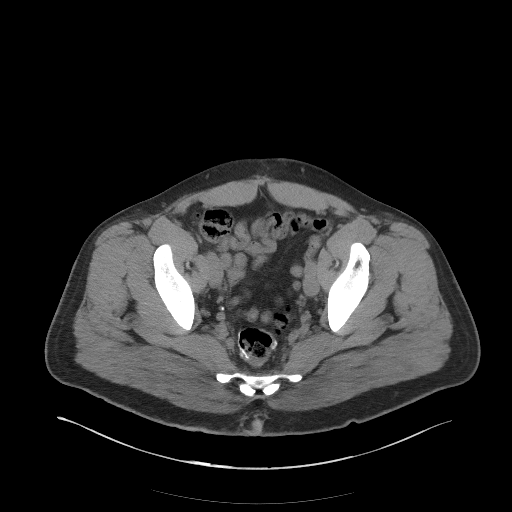
[im 61/132  soft-tissue]
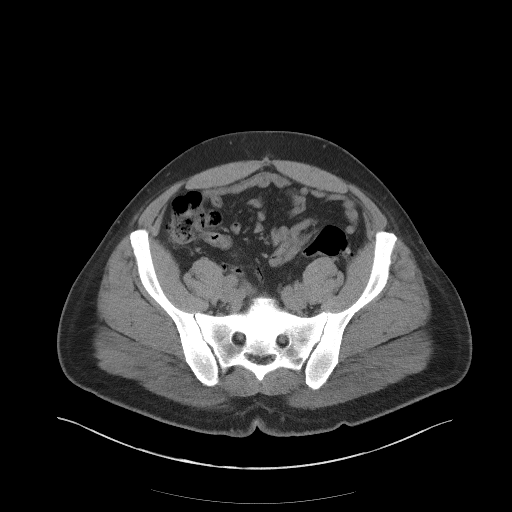
[im 71/132  soft-tissue]
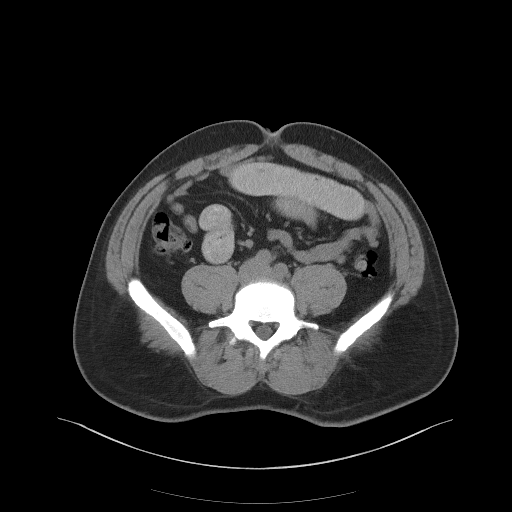
[im 81/132  soft-tissue]
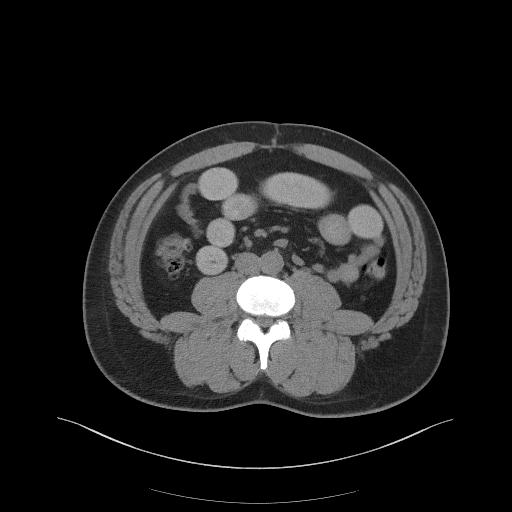
[im 91/132  soft-tissue]
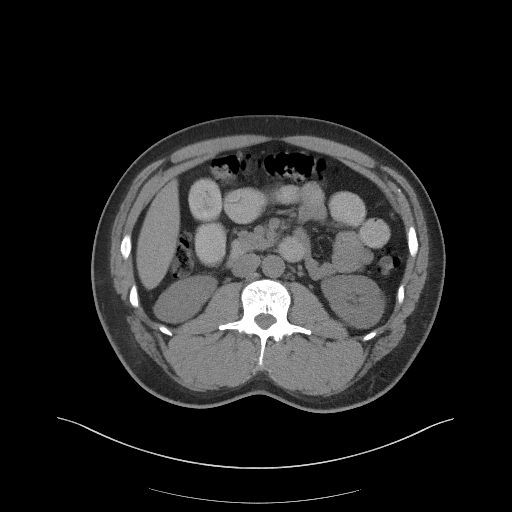
[im 91/132  bone]
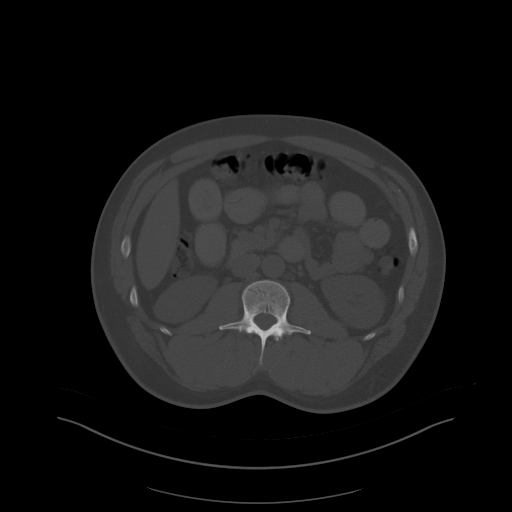
[im 101/132  soft-tissue]
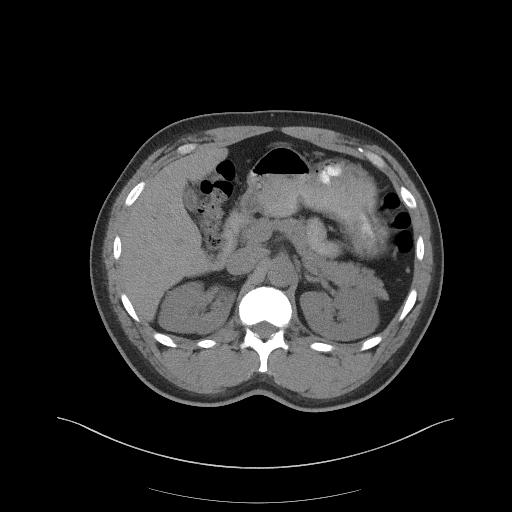
[im 111/132  soft-tissue]
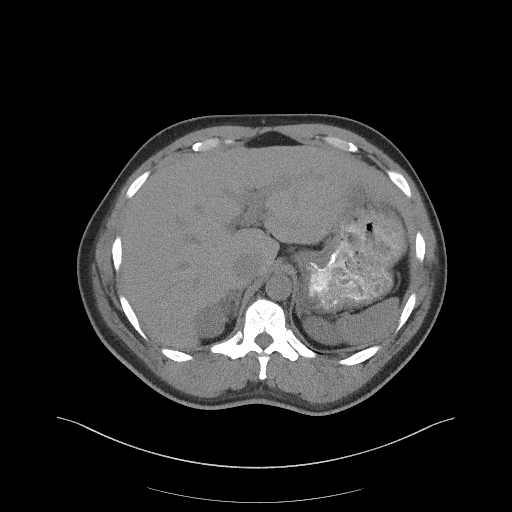
[im 121/132  soft-tissue]
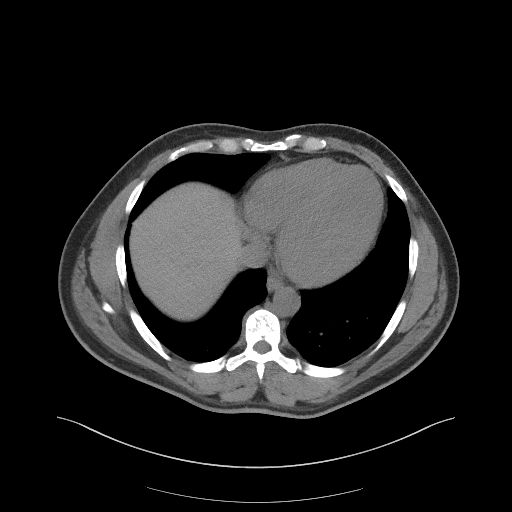

[Series 5: coronal st · coronal · 0.90mm/px · 3 of 113 slices shown]
[im 38/113  soft-tissue]
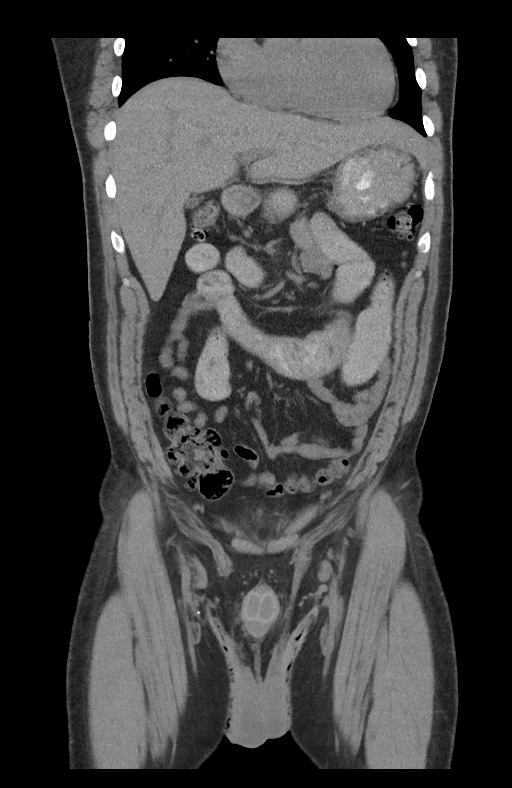
[im 50/113  soft-tissue]
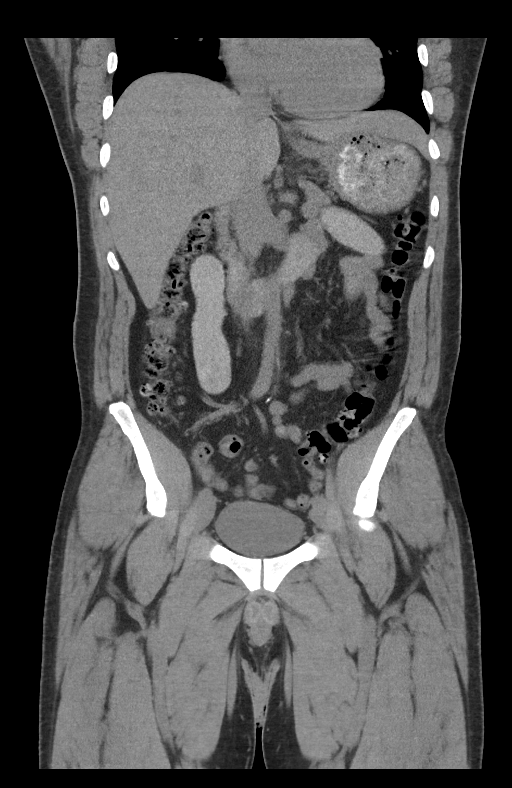
[im 63/113  soft-tissue]
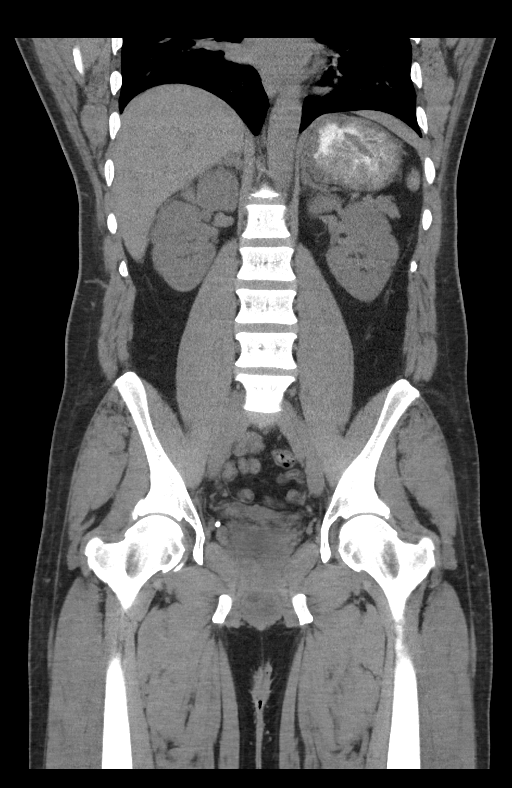

[15 of 46 positions shown; findings below may reference images not displayed]

FINDINGS: Heart size is upper normal. No pleural or pericardial effusion. Mild
dependent atelectatic change is noted the lung bases.

Proximal small bowel loops are dilated up to 3.5 cm. Transition
point is seen in the left upper quadrant the abdomen where loops
appear adhesed together. More distal loops are completely
decompressed. Surgical anastomosis at the rectosigmoid junction
consistent with partial sigmoid resection is noted and unchanged.
Scattered diverticular seen in the colon. No evidence of
diverticulitis. The appendix appears normal. No pneumatosis, portal
venous gas or free intraperitoneal air is identified. There is no
fluid collection.

The kidneys, liver, gallbladder, adrenal glands, spleen and pancreas
are unremarkable. No lymphadenopathy is seen.

No lytic or sclerotic bony lesion is identified. Prominent accessory
ossicles about the acetabuli bilaterally are noted.
IMPRESSION: Findings compatible with partial or early small bowel obstruction
which appears to be due to adhesions. Transition point is likely in
the left upper quadrant of the abdomen where small bowel loops
appear adhesed together. No evidence of bowel ischemia.

Diverticulosis without diverticulitis. Postoperative change sigmoid
colon noted.

## 2018-12-24 ENCOUNTER — Emergency Department (HOSPITAL_BASED_OUTPATIENT_CLINIC_OR_DEPARTMENT_OTHER): Payer: Managed Care, Other (non HMO)

## 2018-12-24 ENCOUNTER — Encounter (HOSPITAL_BASED_OUTPATIENT_CLINIC_OR_DEPARTMENT_OTHER): Payer: Self-pay

## 2018-12-24 ENCOUNTER — Emergency Department (HOSPITAL_BASED_OUTPATIENT_CLINIC_OR_DEPARTMENT_OTHER)
Admission: EM | Admit: 2018-12-24 | Discharge: 2018-12-24 | Disposition: A | Discharge status: Home / Self Care | Payer: Managed Care, Other (non HMO) | Attending: Emergency Medicine | Admitting: Emergency Medicine

## 2018-12-24 ENCOUNTER — Other Ambulatory Visit: Payer: Self-pay

## 2018-12-24 DIAGNOSIS — I10 Essential (primary) hypertension: Secondary | ICD-10-CM | POA: Insufficient documentation

## 2018-12-24 DIAGNOSIS — R1084 Generalized abdominal pain: Secondary | ICD-10-CM | POA: Insufficient documentation

## 2018-12-24 DIAGNOSIS — Z79899 Other long term (current) drug therapy: Secondary | ICD-10-CM | POA: Insufficient documentation

## 2018-12-24 DIAGNOSIS — F17210 Nicotine dependence, cigarettes, uncomplicated: Secondary | ICD-10-CM | POA: Insufficient documentation

## 2018-12-24 LAB — URINALYSIS, ROUTINE W REFLEX MICROSCOPIC
BILIRUBIN URINE: NEGATIVE
Glucose, UA: NEGATIVE mg/dL
Ketones, ur: NEGATIVE mg/dL
Leukocytes,Ua: NEGATIVE
Nitrite: NEGATIVE
PROTEIN: NEGATIVE mg/dL
Specific Gravity, Urine: 1.015 (ref 1.005–1.030)
pH: 6.5 (ref 5.0–8.0)

## 2018-12-24 LAB — CBC WITH DIFFERENTIAL/PLATELET
Abs Immature Granulocytes: 0.03 10*3/uL (ref 0.00–0.07)
Basophils Absolute: 0.1 10*3/uL (ref 0.0–0.1)
Basophils Relative: 1 %
EOS ABS: 0.1 10*3/uL (ref 0.0–0.5)
Eosinophils Relative: 2 %
HEMATOCRIT: 34.9 % — AB (ref 39.0–52.0)
HEMOGLOBIN: 11.1 g/dL — AB (ref 13.0–17.0)
Immature Granulocytes: 0 %
LYMPHS ABS: 2.9 10*3/uL (ref 0.7–4.0)
Lymphocytes Relative: 41 %
MCH: 27.7 pg (ref 26.0–34.0)
MCHC: 31.8 g/dL (ref 30.0–36.0)
MCV: 87 fL (ref 80.0–100.0)
MONO ABS: 0.9 10*3/uL (ref 0.1–1.0)
Monocytes Relative: 12 %
Neutro Abs: 3.2 10*3/uL (ref 1.7–7.7)
Neutrophils Relative %: 44 %
Platelets: 253 10*3/uL (ref 150–400)
RBC: 4.01 MIL/uL — ABNORMAL LOW (ref 4.22–5.81)
RDW: 15.6 % — ABNORMAL HIGH (ref 11.5–15.5)
WBC: 7.2 10*3/uL (ref 4.0–10.5)
nRBC: 0 % (ref 0.0–0.2)

## 2018-12-24 LAB — URINALYSIS, MICROSCOPIC (REFLEX)

## 2018-12-24 LAB — COMPREHENSIVE METABOLIC PANEL
ALK PHOS: 66 U/L (ref 38–126)
ALT: 34 U/L (ref 0–44)
AST: 29 U/L (ref 15–41)
Albumin: 3.8 g/dL (ref 3.5–5.0)
Anion gap: 8 (ref 5–15)
BUN: 13 mg/dL (ref 6–20)
CALCIUM: 8.9 mg/dL (ref 8.9–10.3)
CO2: 25 mmol/L (ref 22–32)
CREATININE: 0.94 mg/dL (ref 0.61–1.24)
Chloride: 104 mmol/L (ref 98–111)
Glucose, Bld: 167 mg/dL — ABNORMAL HIGH (ref 70–99)
Potassium: 3.2 mmol/L — ABNORMAL LOW (ref 3.5–5.1)
SODIUM: 137 mmol/L (ref 135–145)
Total Bilirubin: 0.4 mg/dL (ref 0.3–1.2)
Total Protein: 7 g/dL (ref 6.5–8.1)

## 2018-12-24 LAB — LIPASE, BLOOD: Lipase: 33 U/L (ref 11–51)

## 2018-12-24 MED ORDER — MORPHINE SULFATE (PF) 4 MG/ML IV SOLN
4.0000 mg | Freq: Once | INTRAVENOUS | Status: AC
  Administered 2018-12-24: 4 mg via INTRAVENOUS
  Filled 2018-12-24: qty 1

## 2018-12-24 MED ORDER — LISINOPRIL 10 MG PO TABS: 10.0000 mg | ORAL_TABLET | Freq: Every day | ORAL | 0 refills | Status: AC

## 2018-12-24 MED ORDER — POTASSIUM CHLORIDE CRYS ER 20 MEQ PO TBCR
40.0000 meq | EXTENDED_RELEASE_TABLET | Freq: Once | ORAL | Status: AC
  Administered 2018-12-24: 40 meq via ORAL
  Filled 2018-12-24: qty 2

## 2018-12-24 MED ORDER — SODIUM CHLORIDE 0.9 % IV BOLUS
1000.0000 mL | Freq: Once | INTRAVENOUS | Status: AC
  Administered 2018-12-24: 1000 mL via INTRAVENOUS

## 2018-12-24 MED ORDER — AMLODIPINE BESYLATE 5 MG PO TABS: 5.0000 mg | ORAL_TABLET | Freq: Every day | ORAL | 0 refills | Status: DC

## 2018-12-24 MED ORDER — ONDANSETRON HCL 4 MG/2ML IJ SOLN
4.0000 mg | Freq: Once | INTRAMUSCULAR | Status: AC
  Administered 2018-12-24: 4 mg via INTRAVENOUS
  Filled 2018-12-24: qty 2

## 2018-12-24 NOTE — ED Notes (Signed)
Pt verbalizes understanding of d/c instructions and denies any further needs at this time. 

## 2018-12-24 NOTE — Discharge Instructions (Addendum)
I have given you prescriptions today for 1 month of your blood pressure medicines at half dose given you have been not taking them for a while.  Please schedule a appointment with the [facility] wellness clinic.

## 2018-12-24 NOTE — ED Triage Notes (Signed)
Pt c/o abd pain, diarrhea x 1 week-states feels same as diverticulitis pain-pt also c/o pain to right UE x 2 week-denies injury-NAD-steady gait

## 2018-12-24 NOTE — ED Provider Notes (Signed)
[facility] EMERGENCY DEPARTMENT Provider Note   CSN: [id number] Arrival date & time: 12/24/18  1847    History   Chief Complaint Chief Complaint  Patient presents with  . Abdominal Pain    HPI Joshua Patel is a 38 y.o. male with a past medical history of partial small bowel obstruction, diverticulosis, who presents today for evaluation of abdominal pain.  He reports that his abdominal pain is been generalized over the past 2 weeks.  He does report occasional fevers at home.  He reports nausea vomiting and diarrhea.  He does not have any blood in his vomit or bowel movements.  He denies any marijuana use.  He says that this feels similar to his diverticulitis in the past and location however the time span it has been going on for is abnormal.  Denies any dysuria increased frequency or urgency.  No penile discharge or concern of sexually transmitted infection.     HPI  Past Medical History:  Diagnosis Date  . Diverticulitis 2010, 2011  . Hypertension     Patient Active Problem List   Diagnosis Date Noted  . Partial small bowel obstruction (HCC) 05/08/2016  . HTN (hypertension) 05/08/2016    Past Surgical History:  Procedure Laterality Date  . COLON SURGERY    . PARTIAL COLECTOMY  2010        Home Medications    Prior to Admission medications   Medication Sig Start Date End Date Taking? Authorizing Provider  amLODipine (NORVASC) 5 MG tablet Take 1 tablet (5 mg total) by mouth daily for 30 days. 12/24/18 01/23/19  Cristina Gong, PA-C  famotidine (PEPCID) 20 MG tablet Take 1 tablet (20 mg total) by mouth daily. Patient not taking: Reported on 11/12/2016 08/29/16   Lavera Guise, MD  HYDROcodone-acetaminophen (NORCO/VICODIN) 5-325 MG tablet Take 1-2 tablets by mouth every 6 (six) hours as needed. 11/26/16   Alvina Chou, PA  lisinopril (PRINIVIL,ZESTRIL) 10 MG tablet Take 1 tablet (10 mg total) by mouth daily for 30 days. 12/24/18 01/23/19   Cristina Gong, PA-C  naproxen (NAPROSYN) 500 MG tablet Take 1 tablet (500 mg total) by mouth 2 (two) times daily as needed for mild pain, moderate pain or headache (TAKE WITH MEALS.). 11/12/16   Street, Mercedes, PA-C  omeprazole (PRILOSEC) 20 MG capsule Take 1 capsule (20 mg total) by mouth daily. 04/29/17   Little, Ambrose Finland, MD  ondansetron (ZOFRAN ODT) 4 MG disintegrating tablet Take 1 tablet (4 mg total) by mouth every 4 (four) hours as needed for nausea or vomiting. Patient not taking: Reported on 11/12/2016 10/26/16   Arby Barrette, MD  ondansetron (ZOFRAN ODT) 4 MG disintegrating tablet Take 1 tablet (4 mg total) by mouth every 8 (eight) hours as needed for nausea or vomiting. 11/12/16   Street, Mercedes, PA-C  sucralfate (CARAFATE) 1 g tablet Take 1 tablet (1 g total) by mouth 4 (four) times daily -  with meals and at bedtime. 04/29/17 05/06/17  Little, Ambrose Finland, MD    Family History No family history on file.  Social History Social History   Tobacco Use  . Smoking status: Current Some Day Smoker    Packs/day: 0.50    Types: Cigarettes  . Smokeless tobacco: Current User  Substance Use Topics  . Alcohol use: Not Currently  . Drug use: No     Allergies   Shellfish allergy and Contrast media [iodinated diagnostic agents]   Review of Systems Review of Systems  Constitutional: Positive for chills and fever.  Respiratory: Negative for chest tightness and shortness of breath.   Cardiovascular: Negative for chest pain.  Gastrointestinal: Positive for abdominal pain, diarrhea, nausea and vomiting. Negative for anal bleeding, blood in stool and rectal pain.  Genitourinary: Negative for dysuria and hematuria.  Musculoskeletal: Negative for back pain and neck pain.  Skin: Negative for rash.  Neurological: Negative for weakness.  All other systems reviewed and are negative.    Physical Exam Updated Vital Signs BP (!) 159/103 (BP Location: Right Arm)   Pulse 64    Temp 98.1 F (36.7 C) (Oral)   Resp 16   Ht 6\' 5"  (1.956 m)   Wt 113.5 kg   SpO2 98%   BMI 29.67 kg/m   Physical Exam Vitals signs and nursing note reviewed.  Constitutional:      General: He is not in acute distress.    Appearance: He is well-developed.  HENT:     Head: Normocephalic and atraumatic.     Mouth/Throat:     Mouth: Mucous membranes are moist.  Eyes:     Extraocular Movements: Extraocular movements intact.     Conjunctiva/sclera: Conjunctivae normal.  Neck:     Musculoskeletal: Neck supple.  Cardiovascular:     Rate and Rhythm: Normal rate and regular rhythm.     Heart sounds: Normal heart sounds. No murmur.  Pulmonary:     Effort: Pulmonary effort is normal. No respiratory distress.     Breath sounds: Normal breath sounds.  Abdominal:     General: Abdomen is flat. There is no distension.     Palpations: Abdomen is soft.     Tenderness: There is generalized abdominal tenderness. There is no right CVA tenderness, left CVA tenderness, guarding or rebound. Negative signs include Murphy's sign.  Genitourinary:    Comments: Patient refused rectal exam.  Skin:    General: Skin is warm and dry.  Neurological:     General: No focal deficit present.     Mental Status: He is alert.     Cranial Nerves: No cranial nerve deficit.     Motor: No weakness.  Psychiatric:        Mood and Affect: Mood normal.        Behavior: Behavior normal.      ED Treatments / Results  Labs (all labs ordered are listed, but only abnormal results are displayed) Labs Reviewed  URINALYSIS, ROUTINE W REFLEX MICROSCOPIC - Abnormal; Notable for the following components:      Result Value   Hgb urine dipstick TRACE (*)    All other components within normal limits  URINALYSIS, MICROSCOPIC (REFLEX) - Abnormal; Notable for the following components:   Bacteria, UA RARE (*)    All other components within normal limits  COMPREHENSIVE METABOLIC PANEL - Abnormal; Notable for the following  components:   Potassium 3.2 (*)    Glucose, Bld 167 (*)    All other components within normal limits  CBC WITH DIFFERENTIAL/PLATELET - Abnormal; Notable for the following components:   RBC 4.01 (*)    Hemoglobin 11.1 (*)    HCT 34.9 (*)    RDW 15.6 (*)    All other components within normal limits  LIPASE, BLOOD    EKG None  Radiology Ct Abdomen Pelvis Wo Contrast  Result Date: 12/24/2018 CLINICAL DATA:  Suprapubic pain EXAM: CT ABDOMEN AND PELVIS WITHOUT CONTRAST TECHNIQUE: Multidetector CT imaging of the abdomen and pelvis was performed following the standard protocol without  IV contrast. COMPARISON:  12/09/2018 FINDINGS: Lower chest: Lung bases are clear. No effusions. Heart is normal size. Hepatobiliary: Gallbladder is contracted. No focal hepatic abnormality. Pancreas: No focal abnormality or ductal dilatation. Spleen: No focal abnormality.  Normal size. Adrenals/Urinary Tract: No adrenal abnormality. No focal renal abnormality. No stones or hydronephrosis. Urinary bladder is unremarkable. Small exophytic cyst off the midpole of the left kidney measures 15 mm, stable. Urinary bladder decompressed. Stomach/Bowel: Diffuse colonic diverticulosis. No active diverticulitis. Normal appendix. No evidence of bowel obstruction. Vascular/Lymphatic: No evidence of aneurysm or adenopathy. Reproductive: No visible focal abnormality. Other: No free fluid or free air. Musculoskeletal: No acute bony abnormality. IMPRESSION: Diffuse colonic diverticulosis.  No active diverticulitis. No acute findings. Electronically Signed   By: Charlett NoseKevin  Dover M.D.   On: 12/24/2018 21:46    Procedures Procedures (including critical care time)  Medications Ordered in ED Medications  sodium chloride 0.9 % bolus 1,000 mL (0 mLs Intravenous Stopped 12/24/18 2133)  morphine 4 MG/ML injection 4 mg (4 mg Intravenous Given 12/24/18 2019)  ondansetron (ZOFRAN) injection 4 mg (4 mg Intravenous Given 12/24/18 2019)  potassium  chloride SA (K-DUR,KLOR-CON) CR tablet 40 mEq (40 mEq Oral Given 12/24/18 2155)     Initial Impression / Assessment and Plan / ED Course  I have reviewed the triage vital signs and the nursing notes.  Pertinent labs & imaging results that were available during my care of the patient were reviewed by me and considered in my medical decision making (see chart for details).       Patient presents today for evaluation of abdominal pain.  He has a history of diverticulitis and reports he has previously had partial colectomy for this.  Given the abnormal duration of his pain and history of reportedly partial colectomy labs and CT scan were obtained.  Labs show mild anemia, patient denies any blood in his bowel movements and declined rectal exam.  Recommended taking a multivitamin and PCP follow-up.  Urine was obtained which did show trace blood and rare bacteria, however patient is not having any urinary symptoms, therefore will not treat.  His CT scan showed diffuse colonic diverticulosis without evidence of diverticulitis.  He denies marijuana use.  I suspect that his symptoms are primarily viral versus functional abdominal pain.  Recommended bland diet, increasing p.o. fluids.  His potassium was slightly low here and he was given p.o. potassium.  Recommended to eat bananas and other high potassium foods.  I suspect that this is related to poor p.o. intake as he is not currently taking any blood pressure medicines. He was noted to be hypertensive in the emergency room, he says that he has been out of his blood pressure medicines for an extended period of time.  Given that he has been out of them for extended period and was not previously stabilized on them he is given a month supply of his amlodipine and lisinopril at half the dose that he was on previously to ensure he does not become hypotensive.  He is given follow-up with [facility].  On repeat exam he does not have a surgical abdomen.   He is able to tolerate p.o. challenge without difficulty in the department.  His pain was treated with morphine, Zofran and he was given 1 L of IV fluids.  Return precautions were discussed with patient who states their understanding.  At the time of discharge patient denied any unaddressed complaints or concerns.  Patient is agreeable for discharge home.  Final Clinical Impressions(s) / ED Diagnoses   Final diagnoses:  Generalized abdominal pain    ED Discharge Orders         Ordered    lisinopril (PRINIVIL,ZESTRIL) 10 MG tablet  Daily     12/24/18 2207    amLODipine (NORVASC) 5 MG tablet  Daily     12/24/18 2207           Cristina Gong, [place] 12/24/18 Phineas Douglas    Geoffery Lyons, MD 12/27/18 [phone]

## 2018-12-24 NOTE — ED Notes (Addendum)
Pt returned from CT °

## 2019-03-21 ENCOUNTER — Other Ambulatory Visit: Payer: Self-pay

## 2019-03-21 ENCOUNTER — Emergency Department (HOSPITAL_BASED_OUTPATIENT_CLINIC_OR_DEPARTMENT_OTHER): Payer: 59

## 2019-03-21 ENCOUNTER — Emergency Department (HOSPITAL_BASED_OUTPATIENT_CLINIC_OR_DEPARTMENT_OTHER)
Admission: EM | Admit: 2019-03-21 | Discharge: 2019-03-21 | Disposition: A | Discharge status: Home / Self Care | Payer: 59 | Attending: Emergency Medicine | Admitting: Emergency Medicine

## 2019-03-21 ENCOUNTER — Encounter (HOSPITAL_BASED_OUTPATIENT_CLINIC_OR_DEPARTMENT_OTHER): Payer: Self-pay | Admitting: Emergency Medicine

## 2019-03-21 DIAGNOSIS — L03113 Cellulitis of right upper limb: Secondary | ICD-10-CM | POA: Diagnosis not present

## 2019-03-21 DIAGNOSIS — Z79899 Other long term (current) drug therapy: Secondary | ICD-10-CM | POA: Insufficient documentation

## 2019-03-21 DIAGNOSIS — F17210 Nicotine dependence, cigarettes, uncomplicated: Secondary | ICD-10-CM | POA: Insufficient documentation

## 2019-03-21 DIAGNOSIS — M79641 Pain in right hand: Secondary | ICD-10-CM | POA: Diagnosis present

## 2019-03-21 DIAGNOSIS — I10 Essential (primary) hypertension: Secondary | ICD-10-CM | POA: Insufficient documentation

## 2019-03-21 DIAGNOSIS — L03011 Cellulitis of right finger: Secondary | ICD-10-CM

## 2019-03-21 LAB — URINALYSIS, ROUTINE W REFLEX MICROSCOPIC
Bilirubin Urine: NEGATIVE
Glucose, UA: NEGATIVE mg/dL
Ketones, ur: NEGATIVE mg/dL
Leukocytes,Ua: NEGATIVE
Nitrite: NEGATIVE
Protein, ur: NEGATIVE mg/dL
Specific Gravity, Urine: >1.03 — ABNORMAL HIGH (ref 1.005–1.030)
pH: 5.5 (ref 5.0–8.0)

## 2019-03-21 LAB — CBC WITH DIFFERENTIAL/PLATELET
Abs Immature Granulocytes: 0.02 10*3/uL (ref 0.00–0.07)
Basophils Absolute: 0 10*3/uL (ref 0.0–0.1)
Basophils Relative: 1 %
Eosinophils Absolute: 0.1 10*3/uL (ref 0.0–0.5)
Eosinophils Relative: 2 %
HCT: 38.8 % — ABNORMAL LOW (ref 39.0–52.0)
Hemoglobin: 12.4 g/dL — ABNORMAL LOW (ref 13.0–17.0)
Immature Granulocytes: 0 %
Lymphocytes Relative: 36 %
Lymphs Abs: 2.3 10*3/uL (ref 0.7–4.0)
MCH: 27.8 pg (ref 26.0–34.0)
MCHC: 32 g/dL (ref 30.0–36.0)
MCV: 87 fL (ref 80.0–100.0)
Monocytes Absolute: 0.8 10*3/uL (ref 0.1–1.0)
Monocytes Relative: 13 %
Neutro Abs: 3 10*3/uL (ref 1.7–7.7)
Neutrophils Relative %: 48 %
Platelets: 278 10*3/uL (ref 150–400)
RBC: 4.46 MIL/uL (ref 4.22–5.81)
RDW: 16.2 % — ABNORMAL HIGH (ref 11.5–15.5)
WBC: 6.3 10*3/uL (ref 4.0–10.5)
nRBC: 0 % (ref 0.0–0.2)

## 2019-03-21 LAB — COMPREHENSIVE METABOLIC PANEL
ALT: 29 U/L (ref 0–44)
AST: 24 U/L (ref 15–41)
Albumin: 4 g/dL (ref 3.5–5.0)
Alkaline Phosphatase: 65 U/L (ref 38–126)
Anion gap: 9 (ref 5–15)
BUN: 14 mg/dL (ref 6–20)
CO2: 25 mmol/L (ref 22–32)
Calcium: 9.3 mg/dL (ref 8.9–10.3)
Chloride: 106 mmol/L (ref 98–111)
Creatinine, Ser: 0.85 mg/dL (ref 0.61–1.24)
GFR calc Af Amer: >60 mL/min (ref >60)
GFR calc non Af Amer: >60 mL/min (ref >60)
Glucose, Bld: 132 mg/dL — ABNORMAL HIGH (ref 70–99)
Potassium: 4.1 mmol/L (ref 3.5–5.1)
Sodium: 140 mmol/L (ref 135–145)
Total Bilirubin: 0.5 mg/dL (ref 0.3–1.2)
Total Protein: 7.8 g/dL (ref 6.5–8.1)

## 2019-03-21 LAB — URINALYSIS, MICROSCOPIC (REFLEX)

## 2019-03-21 LAB — TROPONIN I: Troponin I: <0.03 ng/mL (ref <0.03)

## 2019-03-21 MED ORDER — DOXYCYCLINE HYCLATE 100 MG PO TABS
100.0000 mg | ORAL_TABLET | Freq: Once | ORAL | Status: AC
  Administered 2019-03-21: 10:00:00 100 mg via ORAL
  Filled 2019-03-21: qty 1

## 2019-03-21 MED ORDER — METOCLOPRAMIDE HCL 5 MG/ML IJ SOLN
10.0000 mg | Freq: Once | INTRAMUSCULAR | Status: AC
  Administered 2019-03-21: 09:00:00 10 mg via INTRAVENOUS
  Filled 2019-03-21: qty 2

## 2019-03-21 MED ORDER — DOXYCYCLINE HYCLATE 100 MG PO CAPS: 100.0000 mg | ORAL_CAPSULE | Freq: Two times a day (BID) | ORAL | 0 refills | Status: AC

## 2019-03-21 MED ORDER — AMLODIPINE BESYLATE 5 MG PO TABS: 5.0000 mg | ORAL_TABLET | Freq: Every day | ORAL | 0 refills | Status: AC

## 2019-03-21 MED ORDER — AMLODIPINE BESYLATE 5 MG PO TABS
5.0000 mg | ORAL_TABLET | Freq: Once | ORAL | Status: AC
  Administered 2019-03-21: 09:00:00 5 mg via ORAL
  Filled 2019-03-21: qty 1

## 2019-03-21 MED ORDER — IBUPROFEN 800 MG PO TABS
800.0000 mg | ORAL_TABLET | Freq: Once | ORAL | Status: AC
  Administered 2019-03-21: 800 mg via ORAL
  Filled 2019-03-21: qty 1

## 2019-03-21 NOTE — ED Triage Notes (Signed)
R thumb pain x 4 days.

## 2019-03-21 NOTE — ED Notes (Signed)
Pt now advises he has had headache and chest tightness for several days.

## 2019-03-21 NOTE — ED Provider Notes (Signed)
[facility] EMERGENCY DEPARTMENT Provider Note   CSN: [id number] Arrival date & time: 03/21/19  [id number]    History   Chief Complaint Chief Complaint  Patient presents with   Hand Pain   Headache    HPI Joshua Patel is a 38 y.o. male.     The history is provided by the patient and medical records. No language interpreter was used.  Hand Pain  Associated symptoms include headaches.  Headache   Joshua Patel is a 38 y.o. male who presents to the Emergency Department complaining of HA, hand pain. He is a right-hand dominant male that presents to the ED for evaluation of four days of atraumatic right thumb pain. He states that the thumb feels sore and swollen and changes colors often on. No prior similar symptoms. He also has a history of hypertension and has been off of his medications for the last several months. He complains of several weeks to months of intermittent paresthesias to the right arm that come and go, worse with movement as well as transient headache. He does complain of headache today. He denies any nausea, vomiting, abdominal pain, chest pain, shortness of breath. Symptoms are moderate, waxing and waning, worsening. Past Medical History:  Diagnosis Date   Diverticulitis 2010, 2011   Hypertension     Patient Active Problem List   Diagnosis Date Noted   Partial small bowel obstruction (HCC) 05/08/2016   HTN (hypertension) 05/08/2016    Past Surgical History:  Procedure Laterality Date   COLON SURGERY     PARTIAL COLECTOMY  2010        Home Medications    Prior to Admission medications   Medication Sig Start Date End Date Taking? Authorizing Provider  amLODipine (NORVASC) 5 MG tablet Take 1 tablet (5 mg total) by mouth daily. 03/21/19   Tilden Fossaees, Solmon Bohr, MD  doxycycline (VIBRAMYCIN) 100 MG capsule Take 1 capsule (100 mg total) by mouth 2 (two) times daily. 03/21/19   Tilden Fossaees, Vivien Barretto, MD  famotidine (PEPCID) 20 MG tablet Take 1 tablet (20  mg total) by mouth daily. Patient not taking: Reported on 11/12/2016 08/29/16   Lavera GuiseLiu, Dana Duo, MD  HYDROcodone-acetaminophen (NORCO/VICODIN) 5-325 MG tablet Take 1-2 tablets by mouth every 6 (six) hours as needed. 11/26/16   Alvina ChouEspina, Francisco Manuel, PA  lisinopril (PRINIVIL,ZESTRIL) 10 MG tablet Take 1 tablet (10 mg total) by mouth daily for 30 days. 12/24/18 01/23/19  Cristina GongHammond, Harlea Goetzinger W, PA-C  naproxen (NAPROSYN) 500 MG tablet Take 1 tablet (500 mg total) by mouth 2 (two) times daily as needed for mild pain, moderate pain or headache (TAKE WITH MEALS.). 11/12/16   Street, Mercedes, PA-C  omeprazole (PRILOSEC) 20 MG capsule Take 1 capsule (20 mg total) by mouth daily. 04/29/17   Little, Ambrose Finlandachel Morgan, MD  ondansetron (ZOFRAN ODT) 4 MG disintegrating tablet Take 1 tablet (4 mg total) by mouth every 4 (four) hours as needed for nausea or vomiting. Patient not taking: Reported on 11/12/2016 10/26/16   Arby BarrettePfeiffer, Marcy, MD  ondansetron (ZOFRAN ODT) 4 MG disintegrating tablet Take 1 tablet (4 mg total) by mouth every 8 (eight) hours as needed for nausea or vomiting. 11/12/16   Street, Mercedes, PA-C  sucralfate (CARAFATE) 1 g tablet Take 1 tablet (1 g total) by mouth 4 (four) times daily -  with meals and at bedtime. 04/29/17 05/06/17  Little, Ambrose Finlandachel Morgan, MD    Family History No family history on file.  Social History Social History   Tobacco Use  Smoking status: Current Some Day Smoker    Packs/day: 0.50    Types: Cigarettes   Smokeless tobacco: Current User  Substance Use Topics   Alcohol use: Not Currently   Drug use: No     Allergies   Shellfish allergy and Contrast media [iodinated diagnostic agents]   Review of Systems Review of Systems  Neurological: Positive for headaches.  All other systems reviewed and are negative.    Physical Exam Updated Vital Signs BP (!) 162/109    Pulse 86    Temp 97.9 F (36.6 C) (Oral)    Resp 17    Ht  (1.956 m)    Wt 122.5 kg    SpO2 100%     BMI 32.02 kg/m   Physical Exam Vitals signs and nursing note reviewed.  Constitutional:      Appearance: He is well-developed.  HENT:     Head: Normocephalic and atraumatic.  Cardiovascular:     Rate and Rhythm: Normal rate and regular rhythm.  Pulmonary:     Effort: Pulmonary effort is normal. No respiratory distress.  Abdominal:     Palpations: Abdomen is soft.     Tenderness: There is no abdominal tenderness. There is no guarding or rebound.  Musculoskeletal:     Comments: 2+ radial pulses bilaterally.  Moderate erythema and edema to the pad of the right thumb.  No edema to IP joint.  ROM intact throughout first digit.  No abscess.    Skin:    General: Skin is warm and dry.  Neurological:     Mental Status: He is alert and oriented to person, place, and time.     Comments: 5/5 strength in all four extremities with sensation to light touch intact in all four extremities.    Psychiatric:        Behavior: Behavior normal.      ED Treatments / Results  Labs (all labs ordered are listed, but only abnormal results are displayed) Labs Reviewed  CBC WITH DIFFERENTIAL/PLATELET - Abnormal; Notable for the following components:      Result Value   Hemoglobin 12.4 (*)    HCT 38.8 (*)    RDW 16.2 (*)    All other components within normal limits  URINALYSIS, ROUTINE W REFLEX MICROSCOPIC - Abnormal; Notable for the following components:   Specific Gravity, Urine >1.030 (*)    Hgb urine dipstick SMALL (*)    All other components within normal limits  COMPREHENSIVE METABOLIC PANEL - Abnormal; Notable for the following components:   Glucose, Bld 132 (*)    All other components within normal limits  URINALYSIS, MICROSCOPIC (REFLEX) - Abnormal; Notable for the following components:   Bacteria, UA RARE (*)    All other components within normal limits  TROPONIN I    EKG EKG Interpretation  Date/Time:  Saturday Mar 21 2019 08:44:57 EDT Ventricular Rate:  75 PR Interval:    QRS  Duration: 112 QT Interval:  399 QTC Calculation: 446 R Axis:   77 Text Interpretation:  Sinus rhythm LVH with IVCD and secondary repol abnrm Borderline ST elevation, lateral leads Confirmed by Tilden Fossa [phone]) on 03/21/2019 9:12:19 AM   Radiology Ct Head Wo Contrast  Result Date: 03/21/2019 CLINICAL DATA:  Right upper extremity numbness and tingling. Hypertension. Headache and dizziness. EXAM: CT HEAD WITHOUT CONTRAST TECHNIQUE: Contiguous axial images were obtained from the base of the skull through the vertex without intravenous contrast. COMPARISON:  February 15, 2016 FINDINGS: Brain: Ventricles are  normal in size and configuration. There is no intracranial mass, hemorrhage, extra-axial fluid collection, or midline shift. The brain parenchyma appears unremarkable. No acute infarct evident. Vascular: There is no appreciable hyperdense vessel. There is no appreciable vascular calcification. Skull: The bony calvarium appears intact. Sinuses/Orbits: Visualized paranasal sinuses are clear. There is a concha bullosa on each side, an anatomic variant. Orbits appear symmetric bilaterally. 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 IMPRESSION: Study within normal limits.  Note hypoplastic mastoids bilaterally. Electronically Signed   By: Bretta Bang III M.D.   On: 03/21/2019 08:57   Dg Finger Thumb Right  Result Date: 03/21/2019 CLINICAL DATA:  Pain and swelling 4 days.  No injury EXAM: RIGHT THUMB 2+V COMPARISON:  None. FINDINGS: There is no evidence of fracture or dislocation. There is no evidence of arthropathy or other focal bone abnormality. Soft tissues are unremarkable IMPRESSION: Negative. Electronically Signed   By: Marlan Palau M.D.   On: 03/21/2019 09:05    Procedures Procedures (including critical care time)  Medications Ordered in ED Medications  ibuprofen (ADVIL) tablet 800 mg (has no administration in time range)  doxycycline (VIBRA-TABS) tablet 100 mg (has no  administration in time range)  amLODipine (NORVASC) tablet 5 mg (5 mg Oral Given 03/21/19 0920)  metoCLOPramide (REGLAN) injection 10 mg (10 mg Intravenous Given 03/21/19 0920)     Initial Impression / Assessment and Plan / ED Course  I have reviewed the triage vital signs and the nursing notes.  Pertinent labs & imaging results that were available during my care of the patient were reviewed by me and considered in my medical decision making (see chart for details).       Patient here with complaints of hand pain as well as headache. In terms of his hand pain he does have evidence of developing cellulitis to the part of the. There is no evidence of felon or abscess formation, no tendon involvement. Will start antibiotics. In terms of intermittent paresthesias to the right upper extremity, headache. These symptoms have been ongoing for several weeks to months. He is hypertensive in the emergency department but did not take his home medications. He was treated with his home dose of amlodipine as well as Reglan for headache. His blood pressure significantly improved and his headache resolved. He has no focal neurologic deficits in the emergency department. CT is negative for acute mass or subacute/recent CVA. Presentation is not consistent with CVA, subarachnoid hemorrhage, hypertensive urgency. Discussed with patient home care for hypertension as well as cellulitis. Discussed importance of taking home medications, PCP follow-up and return precautions.  Final Clinical Impressions(s) / ED Diagnoses   Final diagnoses:  Cellulitis of finger of right hand  Essential hypertension    ED Discharge Orders         Ordered    doxycycline (VIBRAMYCIN) 100 MG capsule  2 times daily     03/21/19 1000    amLODipine (NORVASC) 5 MG tablet  Daily     03/21/19 1000           Tilden Fossa, MD 03/21/19 1019

## 2024-10-13 ENCOUNTER — Emergency Department (HOSPITAL_COMMUNITY)
Admission: EM | Admit: 2024-10-13 | Discharge: 2024-10-14 | Payer: Self-pay | Attending: Emergency Medicine | Admitting: Emergency Medicine

## 2024-10-13 ENCOUNTER — Other Ambulatory Visit: Payer: Self-pay

## 2024-10-13 ENCOUNTER — Encounter (HOSPITAL_COMMUNITY): Payer: Self-pay

## 2024-10-13 LAB — CBC
HCT: 39.3 % (ref 39.0–52.0)
Hemoglobin: 12.8 g/dL — ABNORMAL LOW (ref 13.0–17.0)
MCH: 28.3 pg (ref 26.0–34.0)
MCHC: 32.6 g/dL (ref 30.0–36.0)
MCV: 86.9 fL (ref 80.0–100.0)
Platelets: 287 K/uL (ref 150–400)
RBC: 4.52 MIL/uL (ref 4.22–5.81)
RDW: 16.1 % — ABNORMAL HIGH (ref 11.5–15.5)
WBC: 6.9 K/uL (ref 4.0–10.5)
nRBC: 0 % (ref 0.0–0.2)

## 2024-10-13 LAB — BASIC METABOLIC PANEL WITH GFR
Anion gap: 14 (ref 5–15)
BUN: 13 mg/dL (ref 6–20)
CO2: 25 mmol/L (ref 22–32)
Calcium: 10.5 mg/dL — ABNORMAL HIGH (ref 8.9–10.3)
Chloride: 95 mmol/L — ABNORMAL LOW (ref 98–111)
Creatinine, Ser: 1.13 mg/dL (ref 0.61–1.24)
GFR, Estimated: >60 mL/min (ref >60)
Glucose, Bld: 396 mg/dL — ABNORMAL HIGH (ref 70–99)
Potassium: 4.2 mmol/L (ref 3.5–5.1)
Sodium: 135 mmol/L (ref 135–145)

## 2024-10-13 LAB — RESP PANEL BY RT-PCR (RSV, FLU A&B, COVID)  RVPGX2
Influenza A by PCR: NEGATIVE
Influenza B by PCR: NEGATIVE
Resp Syncytial Virus by PCR: NEGATIVE
SARS Coronavirus 2 by RT PCR: NEGATIVE

## 2024-10-13 LAB — LIPASE, BLOOD: Lipase: 31 U/L (ref 11–51)

## 2024-10-13 LAB — TROPONIN T, HIGH SENSITIVITY: Troponin T High Sensitivity: <15 ng/L (ref 0–19)

## 2024-10-13 LAB — PRO BRAIN NATRIURETIC PEPTIDE: Pro Brain Natriuretic Peptide: 450 pg/mL — ABNORMAL HIGH (ref <300.0)

## 2024-10-13 NOTE — ED Provider Triage Note (Signed)
 Emergency Medicine Provider Triage Evaluation Note  Joshua Patel , a 43 y.o. male  was evaluated in triage.  Pt complains of chest pain and abdominal pain with cough/congestion.  Patient notes onset of sharp left-sided chest pain as well as left lower quadrant abdominal pain that has been ongoing since Sunday.  Patient mentions that he has been trying to deal with it at home but his symptoms have only been progressing despite the use of Tylenol .  Patient notes a history of CHF with an ejection fraction in the 20's which he was diagnosed with this year.  He also complains of shortness of breath that is worse on exertion.  No known fever.  Review of Systems  Positive: As above Negative: As above  Physical Exam  Ht 6' 5 (1.956 m)   Wt 117.9 kg   BMI 30.83 kg/m  Gen:   Awake, no distress   Resp:  Normal effort  MSK:   Moves extremities without difficulty  Other:    Medical Decision Making  Medically screening exam initiated at 6:26 PM.  Appropriate orders placed.  Joshua Patel was informed that the remainder of the evaluation will be completed by another provider, this initial triage assessment does not replace that evaluation, and the importance of remaining in the ED until their evaluation is complete.     Joshua Patel SAILOR, [place] 10/13/24 [phone]

## 2024-10-13 NOTE — ED Triage Notes (Signed)
 C/O CP radiating to left side and abd pain that started 2 days ago. SHOB x2 days. Axox4. Denies n/v.

## 2024-10-13 NOTE — ED Notes (Signed)
 Patient left.

## 2024-10-14 NOTE — ED Notes (Signed)
 Called for vitals no answer
